# Patient Record
Sex: Male | Born: 1960
Health system: Southern US, Community
[De-identification: ages and names within clinical notes are randomized; demographics above are authoritative.]

## PROBLEM LIST (undated history)

## (undated) DIAGNOSIS — G4733 Obstructive sleep apnea (adult) (pediatric): Secondary | ICD-10-CM

## (undated) DIAGNOSIS — K648 Other hemorrhoids: Secondary | ICD-10-CM

## (undated) DIAGNOSIS — K219 Gastro-esophageal reflux disease without esophagitis: Secondary | ICD-10-CM

## (undated) DIAGNOSIS — I10 Essential (primary) hypertension: Secondary | ICD-10-CM

## (undated) DIAGNOSIS — E785 Hyperlipidemia, unspecified: Secondary | ICD-10-CM

## (undated) DIAGNOSIS — Z87442 Personal history of urinary calculi: Secondary | ICD-10-CM

## (undated) HISTORY — PX: PROSTATECTOMY: SHX69

## (undated) HISTORY — PX: CARDIOVASCULAR STRESS TEST: SHX262

## (undated) HISTORY — PX: TRANSTHORACIC ECHOCARDIOGRAM: SHX275

---

## 2005-03-01 ENCOUNTER — Inpatient Hospital Stay: Payer: Self-pay | Admitting: Internal Medicine

## 2005-03-01 ENCOUNTER — Other Ambulatory Visit: Payer: Self-pay

## 2005-08-10 ENCOUNTER — Emergency Department: Payer: Self-pay | Admitting: Emergency Medicine

## 2005-11-09 ENCOUNTER — Ambulatory Visit: Payer: Self-pay | Admitting: Gastroenterology

## 2007-04-27 ENCOUNTER — Emergency Department: Payer: Self-pay | Admitting: Emergency Medicine

## 2007-04-28 ENCOUNTER — Emergency Department: Payer: Self-pay | Admitting: Emergency Medicine

## 2007-05-01 ENCOUNTER — Ambulatory Visit: Payer: Self-pay | Admitting: Urology

## 2007-05-04 ENCOUNTER — Ambulatory Visit: Payer: Self-pay | Admitting: Urology

## 2007-05-10 ENCOUNTER — Ambulatory Visit: Payer: Self-pay | Admitting: Urology

## 2007-05-26 ENCOUNTER — Ambulatory Visit: Payer: Self-pay | Admitting: Urology

## 2007-05-29 ENCOUNTER — Ambulatory Visit: Payer: Self-pay | Admitting: Urology

## 2007-05-29 ENCOUNTER — Other Ambulatory Visit: Payer: Self-pay

## 2007-05-30 ENCOUNTER — Ambulatory Visit: Payer: Self-pay | Admitting: Urology

## 2007-06-13 ENCOUNTER — Emergency Department: Payer: Self-pay | Admitting: Emergency Medicine

## 2007-09-21 ENCOUNTER — Ambulatory Visit: Payer: Self-pay | Admitting: Urology

## 2007-09-25 ENCOUNTER — Ambulatory Visit: Payer: Self-pay | Admitting: Specialist

## 2007-10-05 IMAGING — CR DG ELBOW 2V*L*
1 series · 2 of 2 positions shown · non-contrast
Comparison: none

REASON FOR EXAM: pain in elbow  deformity  [HOSPITAL]
COMMENTS:

[Series 1: view not recorded · 0.17mm/px · 2 of 2 slices shown]
[im 1/2]
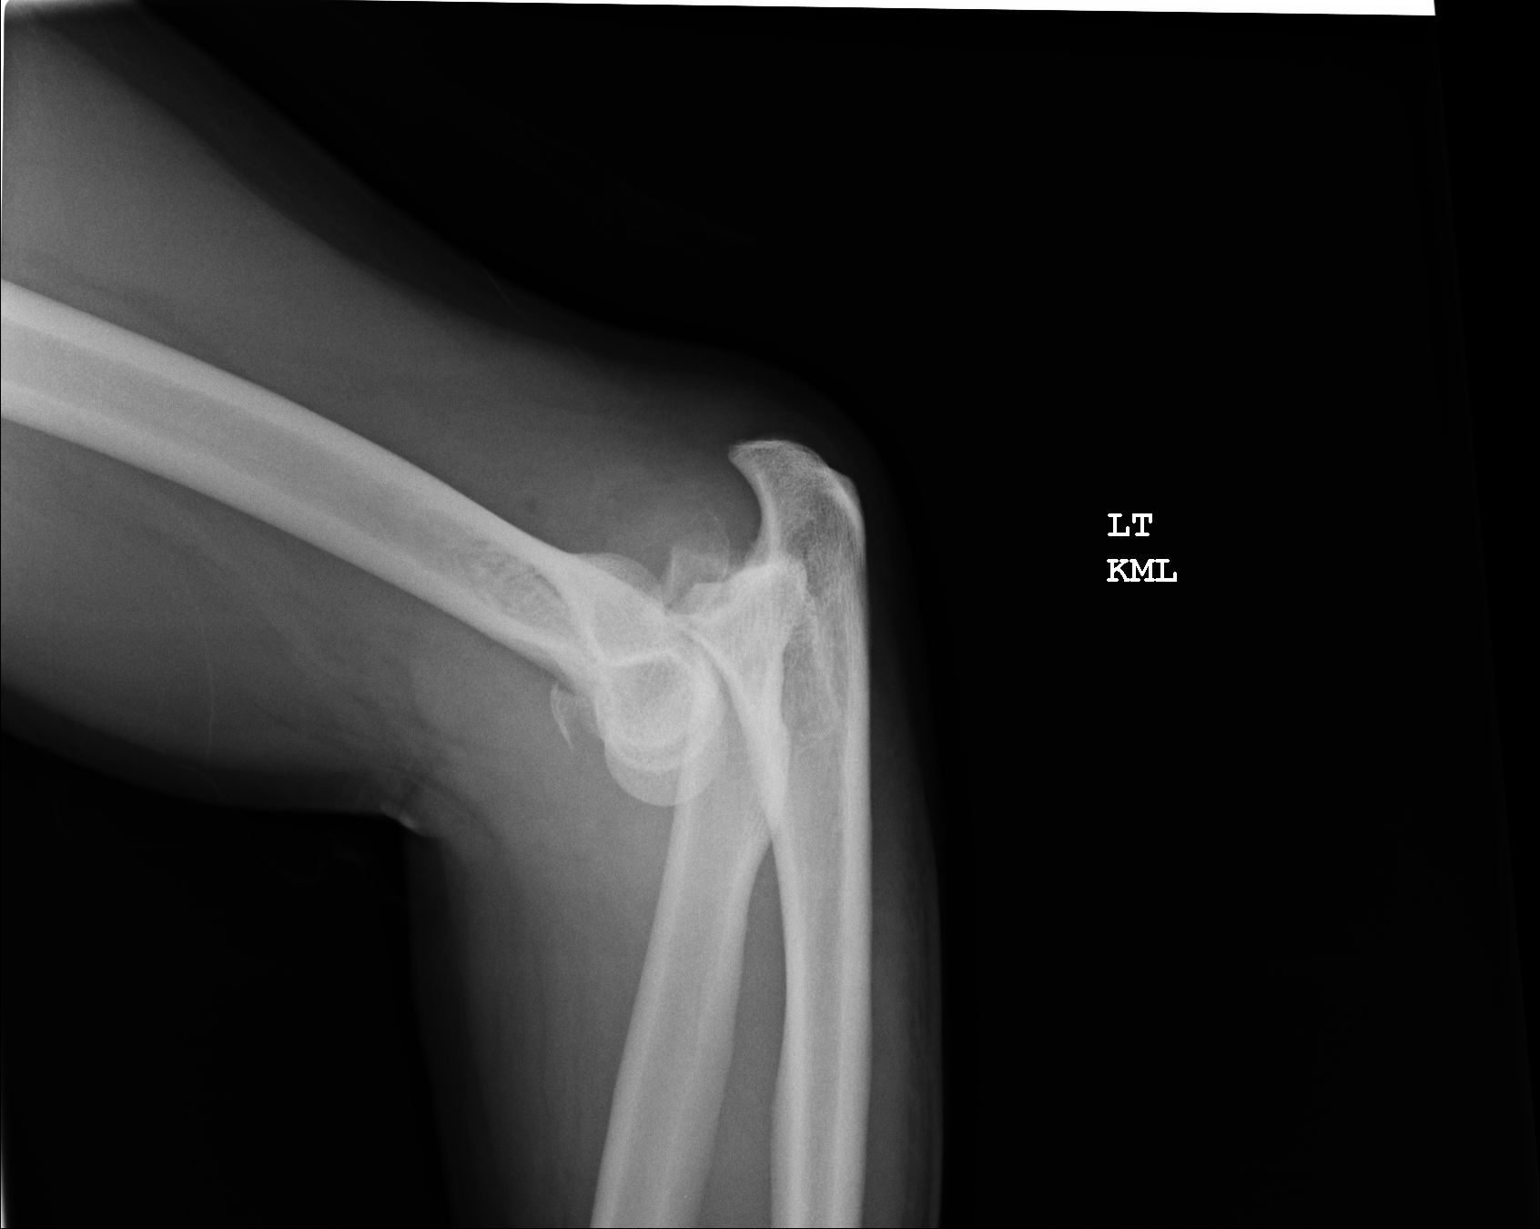
[im 2/2]
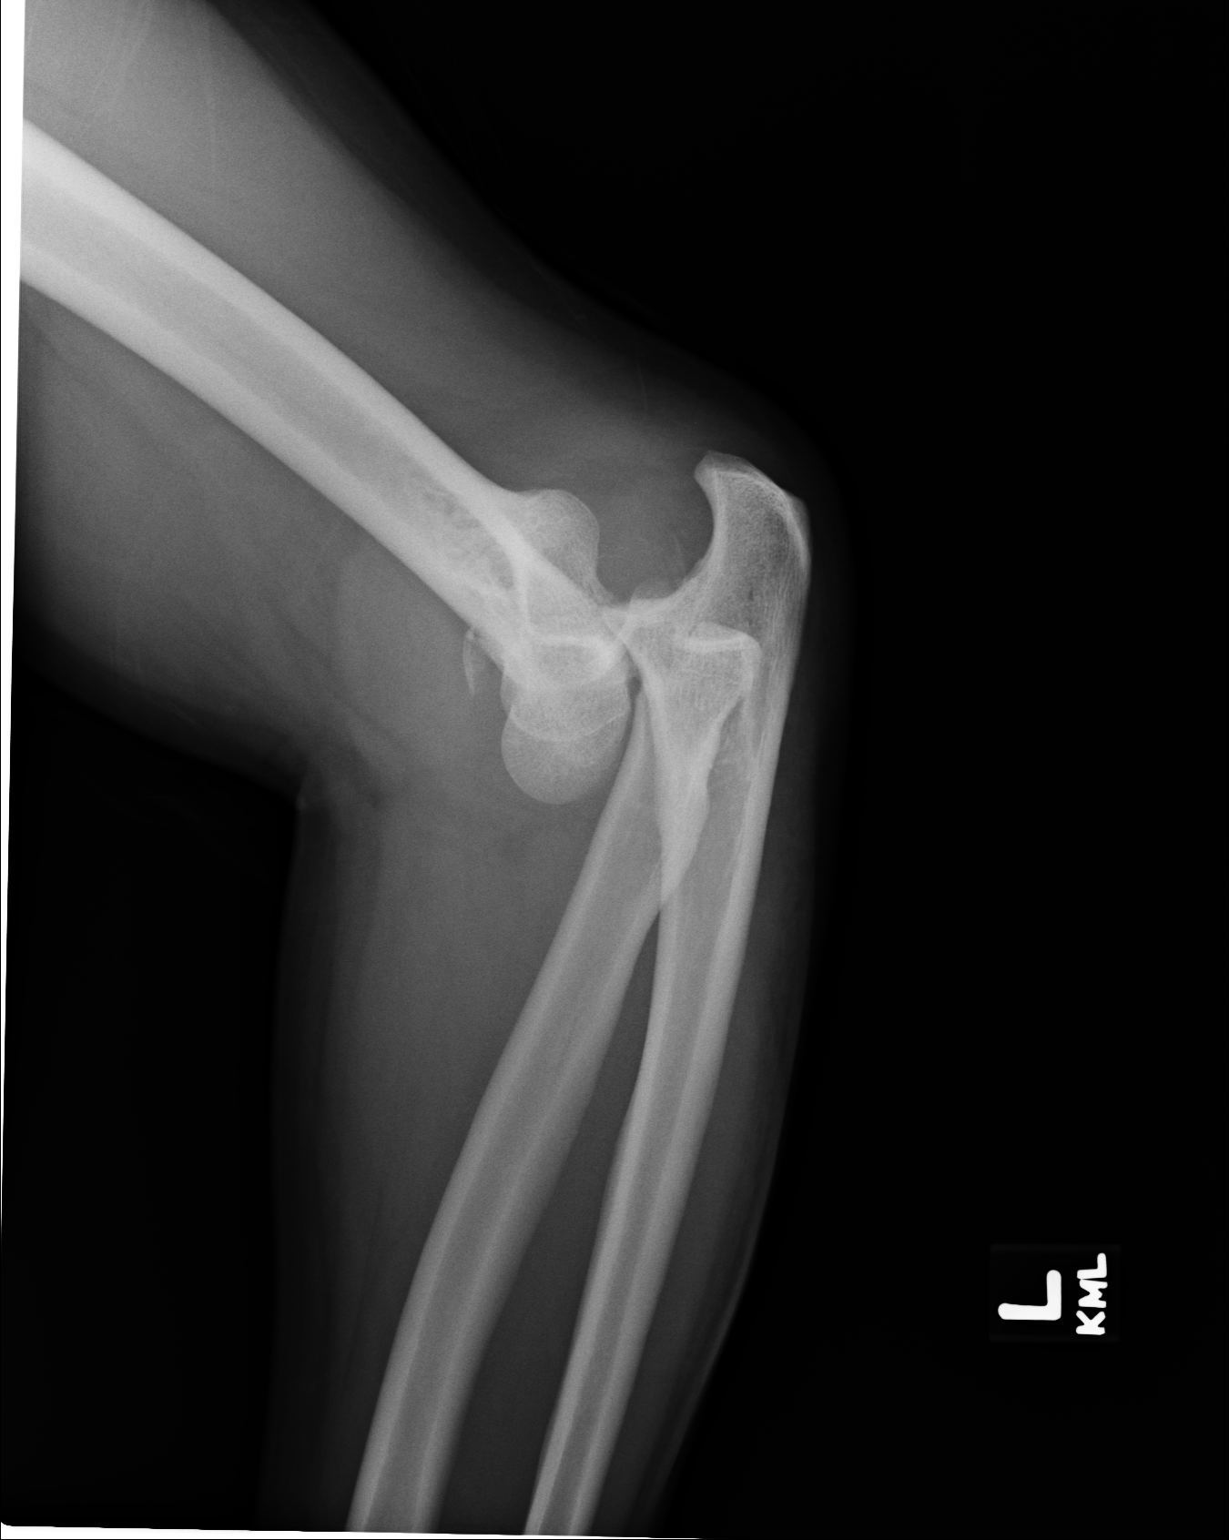

[2 of 2 positions shown; findings below may reference images not displayed]

PROCEDURE:     DXR - DXR ELBOW LEFT AP AND LATERAL  - June 13, 2007  [DATE]

RESULT:     Two lateral views of the elbow were obtained. There is anterior
dislocation of the humerus with respect to the olecranon fossa. There are
noted bony fracture fragments anterior and posterior to the distal humerus.
The origin of these fragments is unclear on the current view but at least
some of the fragments are likely from the radial head which appears deformed.
IMPRESSION: 1.     Fracture-dislocation at the LEFT elbow.

## 2007-10-05 IMAGING — CR DG ELBOW 2V*L*
1 series · 3 of 3 positions shown · non-contrast
Comparison: none

REASON FOR EXAM: post reduction elbow  [HOSPITAL]
COMMENTS:   Bedside (portable):Y

[Series 1: view not recorded · 0.17mm/px · 3 of 3 slices shown]
[im 1/3]
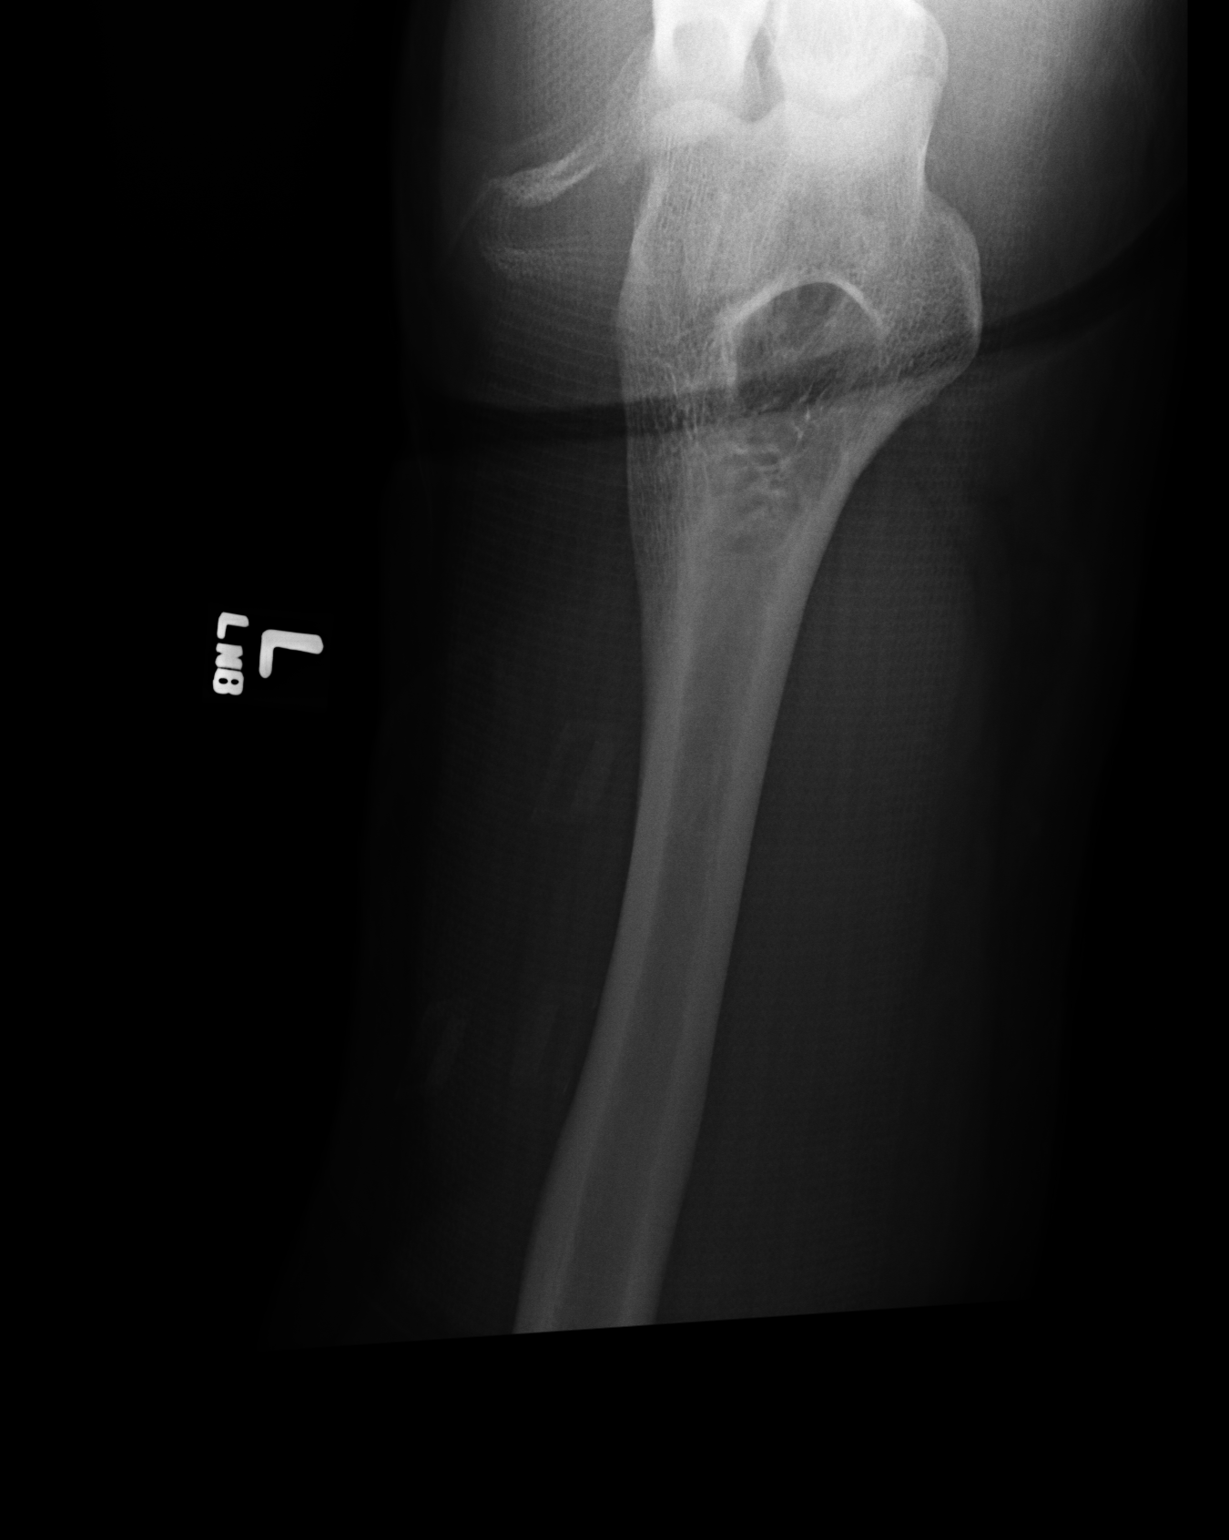
[im 2/3]
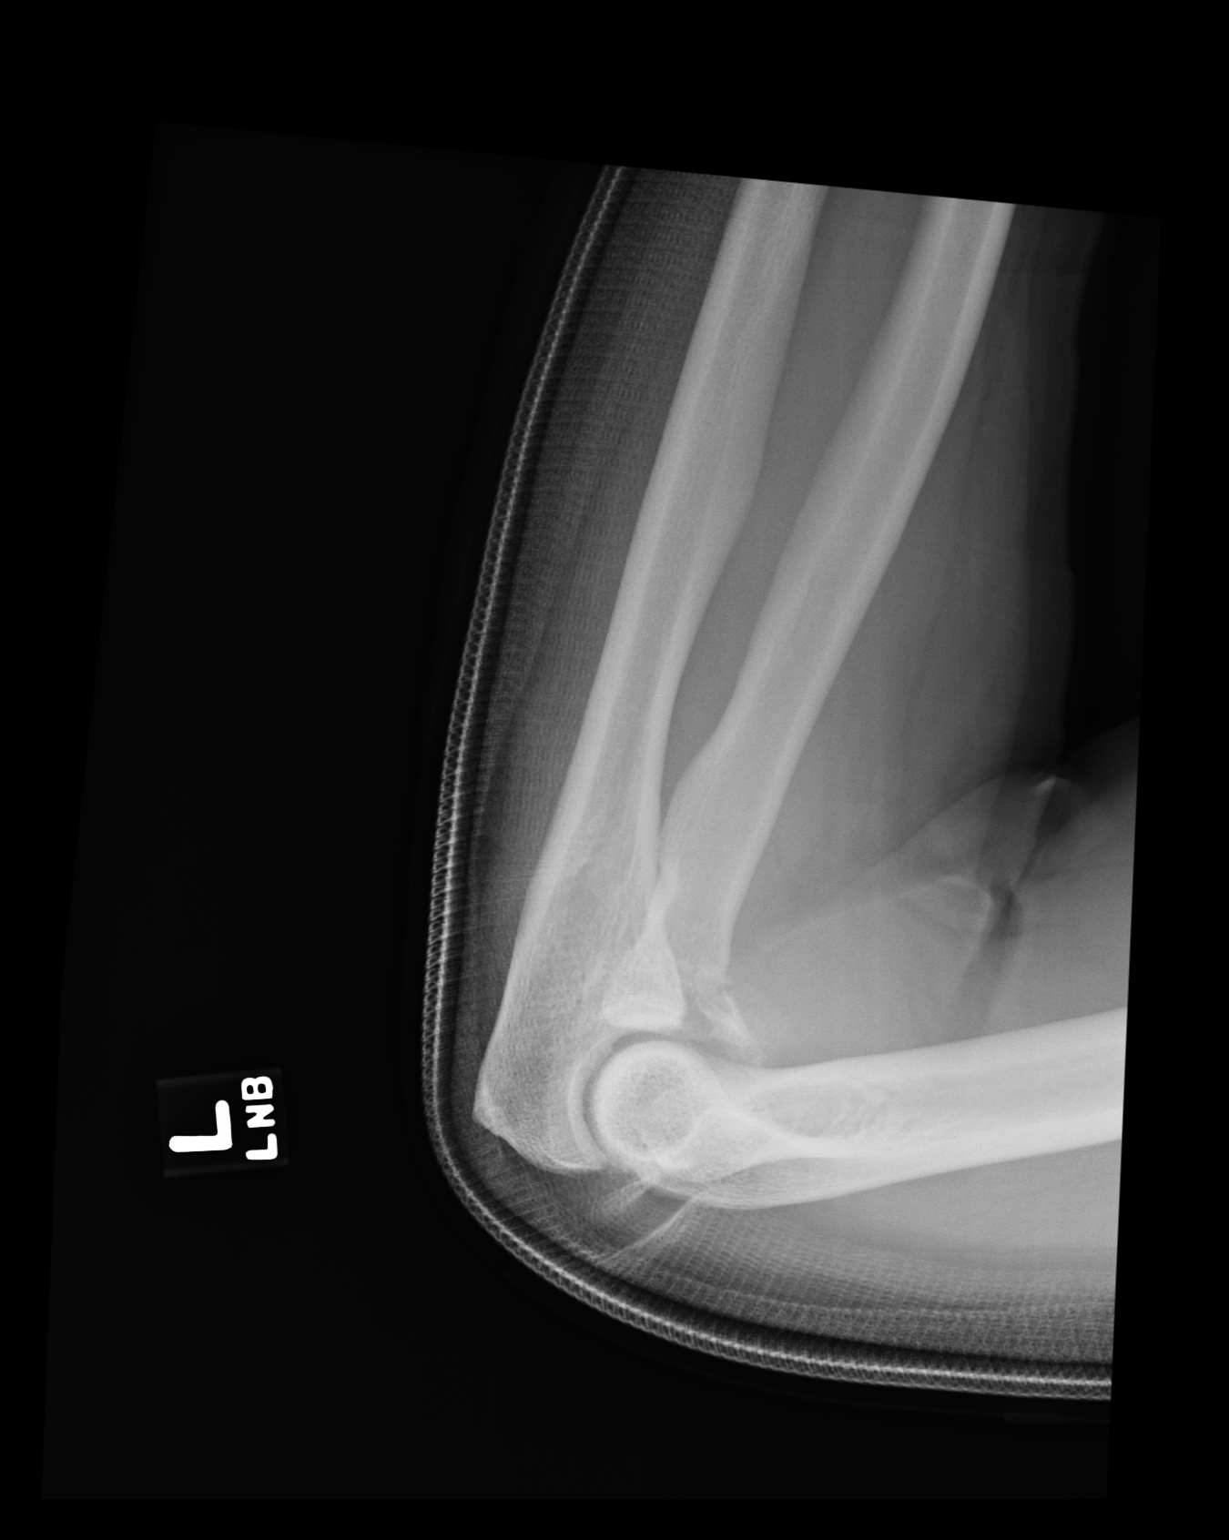
[im 3/3]
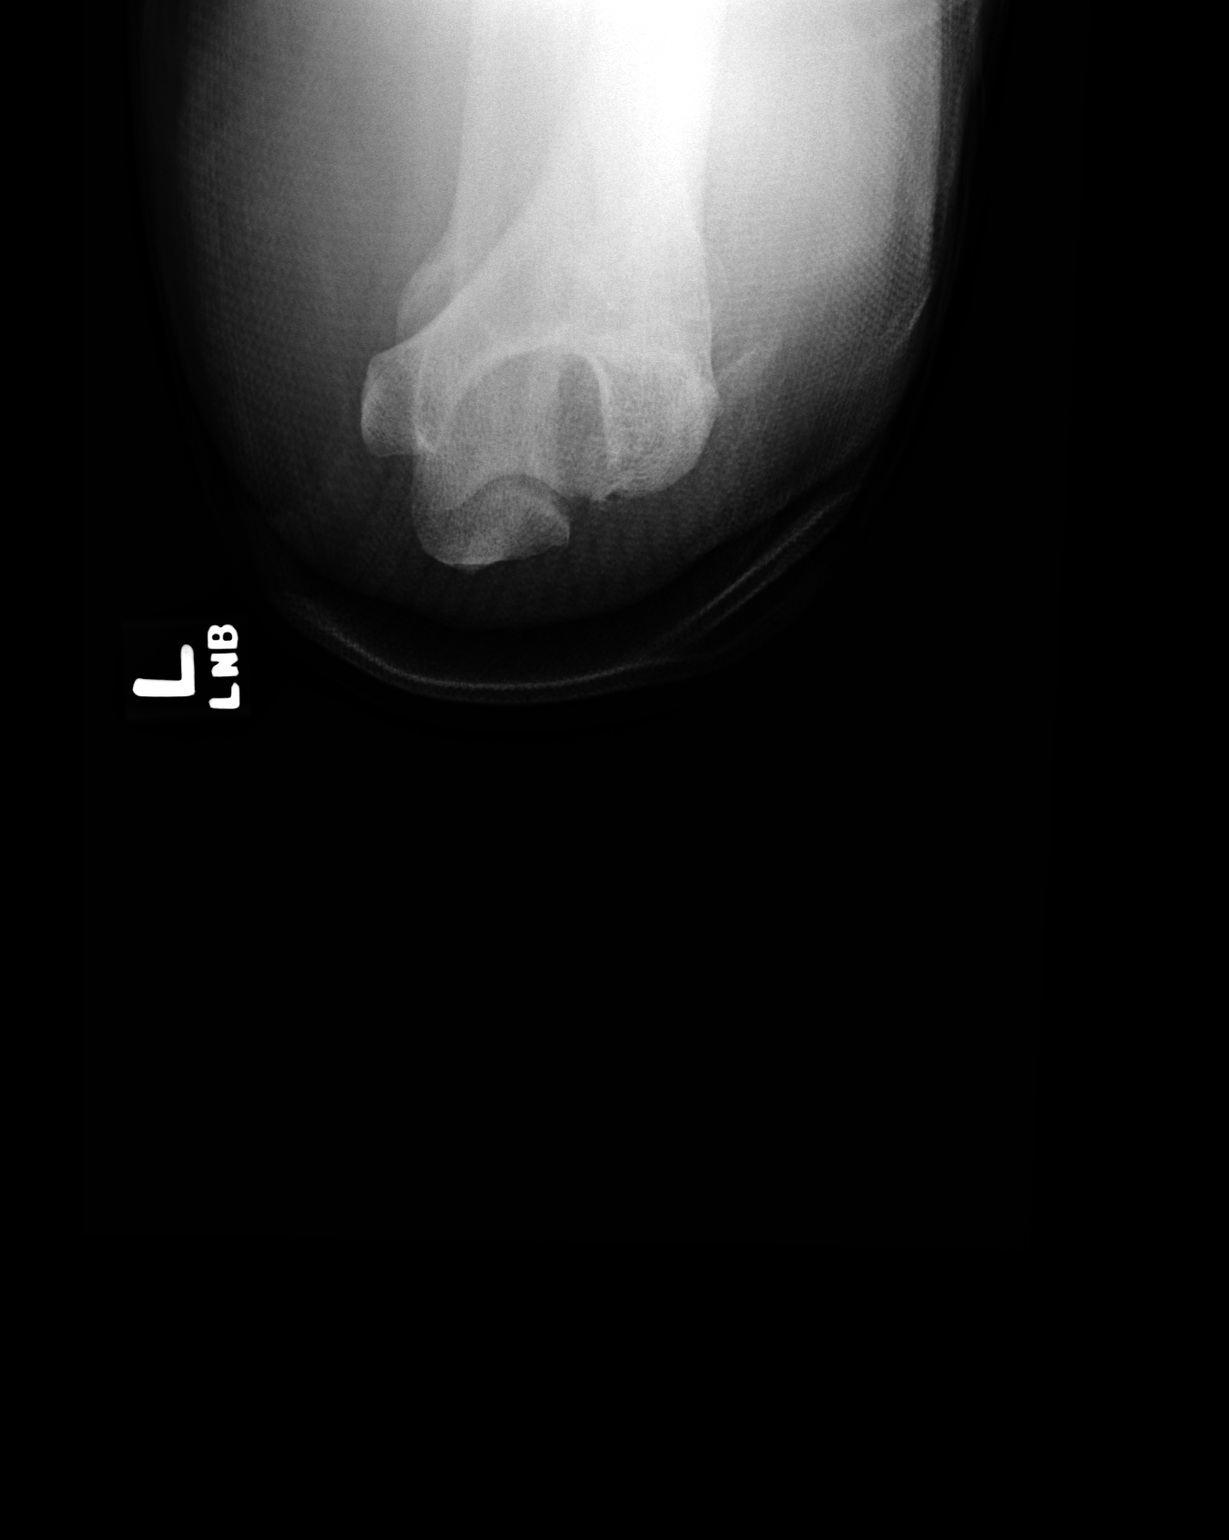

[3 of 3 positions shown; findings below may reference images not displayed]

PROCEDURE:     DXR - DXR ELBOW LEFT AP AND LATERAL  - June 13, 2007  [DATE]

RESULT:     Comparison is made to the prior exam of 06/13/2007. The previously
noted dislocation at the elbow has now been reduced. There is a comminuted
fracture of the radial head. Additionally, there may be a fracture of the
tip of the coronoid process. No fracture of the humerus is seen. A posterior
splint is now present.
IMPRESSION: 1.     Please see above.

## 2008-06-22 ENCOUNTER — Other Ambulatory Visit: Payer: Self-pay

## 2008-06-22 ENCOUNTER — Emergency Department: Payer: Self-pay | Admitting: Unknown Physician Specialty

## 2008-07-03 ENCOUNTER — Ambulatory Visit: Payer: Self-pay | Admitting: Urology

## 2008-09-25 ENCOUNTER — Ambulatory Visit: Payer: Self-pay | Admitting: Urology

## 2009-01-08 ENCOUNTER — Ambulatory Visit (HOSPITAL_COMMUNITY): Admission: RE | Admit: 2009-01-08 | Discharge: 2009-01-09 | Payer: Self-pay | Admitting: Orthopedic Surgery

## 2009-01-08 HISTORY — PX: ANTERIOR CERVICAL DISCECTOMY: SHX1160

## 2011-01-21 LAB — BASIC METABOLIC PANEL
BUN: 15 mg/dL (ref 6–23)
CO2: 28 mEq/L (ref 19–32)
Chloride: 107 mEq/L (ref 96–112)
Creatinine, Ser: 0.81 mg/dL (ref 0.4–1.5)
GFR calc Af Amer: >60 mL/min (ref >60)
Glucose, Bld: 75 mg/dL (ref 70–99)

## 2011-01-21 LAB — CBC
MCHC: 35.9 g/dL (ref 30.0–36.0)
MCV: 84.8 fL (ref 78.0–100.0)
RBC: 4.85 MIL/uL (ref 4.22–5.81)
RDW: 14 % (ref 11.5–15.5)

## 2011-01-21 LAB — DIFFERENTIAL
Basophils Relative: 1 % (ref 0–1)
Eosinophils Absolute: 0.1 10*3/uL (ref 0.0–0.7)
Monocytes Relative: 9 % (ref 3–12)
Neutrophils Relative %: 59 % (ref 43–77)

## 2011-01-21 LAB — GLUCOSE, CAPILLARY

## 2011-02-17 ENCOUNTER — Emergency Department: Payer: Self-pay | Admitting: Emergency Medicine

## 2011-02-23 NOTE — Op Note (Signed)
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   ADDITIONAL DIAGNOSIS:  Right arm radiculopathy C7 distribution.   OPERATIVE PROCEDURE:  Total disk replacement C6-7 and anterior cervical  diskectomy.   SURGEON:  Dahari D. Shon Baton, MD   INSTRUMENTATION USED:  Synthes ProDisc-C, it was a size 5-mm high extra-  deep prosthesis.   COMPLICATIONS:  None.   CONDITION:  Stable.   HISTORY:  Don Morgan is a very pleasant 50 year old gentleman who is having  significant neck and radicular arm pain.  Attempts at conservative  management have failed to alleviate his symptoms.  After discussing  treatment options with him, he elected to proceed with surgery.  All  appropriate risks, benefits, and alternatives were discussed with the  patient and consent was obtained.   OPERATIVE NOTE:  The patient was brought to the operating room, placed  supine on the operating table.  After successful induction of general  anesthesia and endotracheal intubation, TEDs and SCDs were applied.  The  arms were secured to the side.  Roller towels were placed between the  scapula and the shoulders were taped down.  The anterior cervical spine  was prepped and draped in a standard fashion.  A transverse incision was  made just below the level of the cricoid cartilage and sharp dissection  was carried out down to and through the platysma.  I then bluntly  dissected on the left-hand side performing a standard Smith-Robinson  approach to the anterior cervical  spine.   Once I was able to palpate the anterior cervical spine, I then placed a  Cloward retractor and I protected the trachea and esophagus medially,  with the retractor palpated and visualized the carotid sheath laterally  and protected it with my finger.  I then began dissecting the  prevertebral fascia off the anterior longitudinal ligament to expose the  6-7 disk space.  I placed a needle into the 6-7 disk space, took an  intraoperative x-ray and confirmed that I was at the appropriate level.  Once I confirmed I was at the C6-7 level, I began mobilizing the longus  colli muscles.  Using bipolar electrocautery, I obtained hemostasis, and  using an electrocautery I mobilized the longus colli muscles from the  body of C6 to the midbody of C7.  I then placed my deep Caspar  retractors underneath the longus colli muscles and then deflated the  endotracheal cuff.  I expanded the retractors to their final width and  reinflated the endotracheal cuff.  I then checked again with lateral  fluoroscopy that I was at the appropriate level.  Once I checked  again,  I then placed a distraction pin.  Using the AP and lateral fluoroscopy,  I identified the spinous process in the midline of the vertebral body  and placed the C7 40-mm distraction pin parallel to the C7 superior  endplate.  I placed the C6 spinous retractor again midline in the AP  plane and parallel to the C6 inferior endplate in the lateral plate.   At this point with the distraction and retraction in place, I then began  my diskectomy.   A 15-blade scalpel was used to incise the annulus.  Then using a  combination of pituitary rongeurs, curettes, and Kerrison rongeurs, I  resected all of the disk material.  I then removed the posterior  osteophytes with a 1-mm Kerrison and then developed a plane underneath  the posterior longitudinal ligament with a micro nerve hook.  Once I had  developed a plane, I then used my 1-mm Kerrison  rongeur to resect the  posterior longitudinal ligament.  On the right side, there was a  fragment of disk material that I was able to tease out with the nerve  hook.  Once I had completed the diskectomy and removed the ascending  osteophyte, I then measured the interbody space with the trial inserts.  I determined that the 5 large deep was the appropriate size.  I then  made sure it was at the appropriate depth and placed the milling guide  over it.  I then placed a temporary fixation pin into the body of C6 and  then used the mill to mill out the fin cut into C7.  I then placed the  blunt stabilizing pin and then removed the nail and then drilled the fin  cut into C6.  I then cleaned the fin cuts out and then checked again to  ensure that there was no fragments of bone pushed against the thecal  sac.  I then copiously irrigated with normal saline, obtained the  appropriate size disk replacement, and malleted it into the appropriate  depth.  Once it was flushed to the vertebral body under live  fluoroscopy, I then removed the distraction pins and the retractors and  then used bipolar electrocautery and bone wax to seal the bleeding.  The  fixation pins were sealed.  Those holes were sealed with bone wax, and  any other bleeders were coagulated and bone was sealed with bone wax.  I  then copiously irrigated again with normal saline and observed to ensure  that there was no bleeding.  Once I was comfortable with the fact there  was no significant bleeding, I then under live fluoroscopy took the neck  through a full range of motion from flexion to extension and lateral  bending.  I noticed that the prosthesis was working functionally very  fine both with direct visualization and with lateral live fluoroscopy.  I then took a final AP and lateral shot which demonstrated the device to  be properly seated in terms of depth and midline position in the AP.  I  then returned the trachea and  esophagus to midline, closed the platysma  with interrupted 2-0 Vicryl sutures, and the skin with 3-0 Monocryl.  Steri-Strips and dry dressing were applied as was a soft collar.  The  patient was then extubated, transferred to the PACU without incident.  At the end of the case, all needle and sponge counts were correct.       Alvy Beal, MD  Electronically Signed  Alvy Beal, MD  Electronically Signed    DDB/MEDQ  D:  01/08/2009  T:  01/09/2009  Job:  [id number]

## 2011-02-26 NOTE — Discharge Summary (Signed)
NAME:  Don Morgan, Don Morgan NO.:  [id number]   MEDICAL RECORD NO.:  [id number]          PATIENT TYPE:  OIB   LOCATION:  5013                         FACILITY:  [facility]   PHYSICIAN:  Alvy Beal, MD    DATE OF BIRTH:  [birth year]   DATE OF ADMISSION:  01/08/2009  DATE OF DISCHARGE:  01/09/2009                               DISCHARGE SUMMARY   CONSULTATIONS OBTAINED:  None.   ADMISSION DIAGNOSIS:  Cervical disk herniation and degenerative disk  disease at C6-7.   DISCHARGE DIAGNOSIS:  Cervical disk herniation and degenerative disk  disease at C6-7.   BRIEF HISTORY:  [name] is a very pleasant 50 year old gentleman who  was having a significant neck and radicular arm pain.  All attempts at  conservative management consisting of physical therapy, injection  therapy, and pain management had failed to alleviate his symptoms.  After discussing treatment options with him, he elected to proceed with  surgery.  All appropriate risks, benefits, and alternatives were  discussed with the patient and proper consent was obtained.  The  patient's hospital course was approximately 24 hours in length.  The  patient tolerated the procedure very well and was transferred from the  PACU to the orthopedic floor without incident.   Postoperatively, on day 1, the patient was having no complaints of  significant radicular arm pain.  He worked very well with physical  therapy.  He was tolerating a regular diet, had a regular bowel  movements.  Therefore physical therapy worked with him and cleared him  for discharge to home, because he was deemed stable after physical  therapy and occupational therapy had seen him.  He was discharged to  home in stable condition.   DISCHARGE MEDICATIONS:  1. Omeprazole 20 mg.  2. Hydrochlorothiazide 25 mg.  3. Amlodipine 5 mg.  4. Sertraline 100 mg.  5. Percocet 10/325 one tablet p.o. q.6 h. p.r.n. pain.  6. Indomethacin 1 tablet p.o. t.i.d. for 2  weeks, the strength is 800      mg.  7. Robaxin 500 mg 1 tablet p.o. q.8 h. p.r.n. pain.   The patient was instructed to discontinue the use of his  hydrocodone/acetaminophen 5/325.   DISCHARGE INSTRUCTIONS:  The patient was given a preprinted discharge  instruction sheet that went over his activity level such that the  patient is not allowed to lift anything heavier than 6 pounds.  He is to  remain in his hard Aspen cervical collar at all times.  He does not need  to wear his hard cervical collar while in bed.  He is to avoid smoking  or exposure to a cigarette smoke.  He is to walk as much as possible.  The patient is instructed also to call  the office for increased fevers, chills, temperature, or increased pain.  The patient is instructed to call our office at [phone] to schedule his  follow up appointment, which to be 2 weeks from the date of his surgery  for suture removal.  At that time, we will do a wound check and  remove  his sutures.      Crissie Reese, PA      Alvy Beal, MD  Electronically Signed    AC/MEDQ  D:  01/28/2009  T:  01/29/2009  Job:  [id number]   cc:   Alvy Beal, MD  [facility]

## 2012-10-11 HISTORY — PX: CYSTOSCOPY/RETROGRADE/URETEROSCOPY/STONE EXTRACTION WITH BASKET: SHX5317

## 2012-10-11 HISTORY — PX: EXTRACORPOREAL SHOCK WAVE LITHOTRIPSY: SHX1557

## 2014-08-08 ENCOUNTER — Ambulatory Visit: Payer: Self-pay | Admitting: Family Medicine

## 2014-09-20 ENCOUNTER — Emergency Department: Payer: Self-pay | Admitting: Student

## 2016-01-11 ENCOUNTER — Emergency Department: Payer: Medicare Other

## 2016-01-11 ENCOUNTER — Encounter: Payer: Self-pay | Admitting: Emergency Medicine

## 2016-01-11 ENCOUNTER — Emergency Department
Admission: EM | Admit: 2016-01-11 | Discharge: 2016-01-11 | Disposition: A | Discharge status: Home / Self Care | Payer: Medicare Other | Attending: Emergency Medicine | Admitting: Emergency Medicine

## 2016-01-11 DIAGNOSIS — Z885 Allergy status to narcotic agent status: Secondary | ICD-10-CM | POA: Insufficient documentation

## 2016-01-11 DIAGNOSIS — R1030 Lower abdominal pain, unspecified: Secondary | ICD-10-CM | POA: Insufficient documentation

## 2016-01-11 DIAGNOSIS — R109 Unspecified abdominal pain: Secondary | ICD-10-CM

## 2016-01-11 DIAGNOSIS — K5900 Constipation, unspecified: Secondary | ICD-10-CM

## 2016-01-11 DIAGNOSIS — I10 Essential (primary) hypertension: Secondary | ICD-10-CM | POA: Insufficient documentation

## 2016-01-11 HISTORY — DX: Essential (primary) hypertension: I10

## 2016-01-11 LAB — CBC WITH DIFFERENTIAL/PLATELET
BASOS ABS: 0 10*3/uL (ref 0–0.1)
Basophils Relative: 1 %
Eosinophils Absolute: 0 10*3/uL (ref 0–0.7)
Eosinophils Relative: 0 %
HEMATOCRIT: 38.3 % — AB (ref 40.0–52.0)
Hemoglobin: 13 g/dL (ref 13.0–18.0)
LYMPHS ABS: 0.9 10*3/uL — AB (ref 1.0–3.6)
LYMPHS PCT: 19 %
MCH: 26.9 pg (ref 26.0–34.0)
MCHC: 34 g/dL (ref 32.0–36.0)
MCV: 79.1 fL — ABNORMAL LOW (ref 80.0–100.0)
MONO ABS: 0.8 10*3/uL (ref 0.2–1.0)
MONOS PCT: 16 %
NEUTROS ABS: 2.9 10*3/uL (ref 1.4–6.5)
Neutrophils Relative %: 64 %
PLATELETS: 298 10*3/uL (ref 150–440)
RBC: 4.84 MIL/uL (ref 4.40–5.90)
RDW: 15.5 % — AB (ref 11.5–14.5)
WBC: 4.6 10*3/uL (ref 3.8–10.6)

## 2016-01-11 LAB — LIPASE, BLOOD: Lipase: 19 U/L (ref 11–51)

## 2016-01-11 LAB — URINALYSIS COMPLETE WITH MICROSCOPIC (ARMC ONLY)
BACTERIA UA: NONE SEEN
Bilirubin Urine: NEGATIVE
Glucose, UA: NEGATIVE mg/dL
Leukocytes, UA: NEGATIVE
NITRITE: NEGATIVE
PH: 5 (ref 5.0–8.0)
PROTEIN: NEGATIVE mg/dL
SPECIFIC GRAVITY, URINE: 1.017 (ref 1.005–1.030)
Squamous Epithelial / LPF: NONE SEEN

## 2016-01-11 LAB — TROPONIN I

## 2016-01-11 LAB — COMPREHENSIVE METABOLIC PANEL
ALBUMIN: 3.9 g/dL (ref 3.5–5.0)
ALK PHOS: 69 U/L (ref 38–126)
ALT: 23 U/L (ref 17–63)
ANION GAP: 3 — AB (ref 5–15)
AST: 26 U/L (ref 15–41)
BUN: 10 mg/dL (ref 6–20)
CALCIUM: 8.4 mg/dL — AB (ref 8.9–10.3)
CHLORIDE: 105 mmol/L (ref 101–111)
CO2: 25 mmol/L (ref 22–32)
Creatinine, Ser: 0.92 mg/dL (ref 0.61–1.24)
GFR calc Af Amer: >60 mL/min (ref >60)
GFR calc non Af Amer: >60 mL/min (ref >60)
GLUCOSE: 89 mg/dL (ref 65–99)
Potassium: 3.1 mmol/L — ABNORMAL LOW (ref 3.5–5.1)
SODIUM: 133 mmol/L — AB (ref 135–145)
Total Bilirubin: 0.7 mg/dL (ref 0.3–1.2)
Total Protein: 7.6 g/dL (ref 6.5–8.1)

## 2016-01-11 MED ORDER — LACTULOSE 20 G PO PACK: 20.0000 g | PACK | Freq: Three times a day (TID) | ORAL | Status: DC

## 2016-01-11 MED ORDER — DIATRIZOATE MEGLUMINE & SODIUM 66-10 % PO SOLN
15.0000 mL | Freq: Once | ORAL | Status: AC
  Administered 2016-01-11: 15 mL via ORAL

## 2016-01-11 MED ORDER — IOPAMIDOL (ISOVUE-300) INJECTION 61%
100.0000 mL | Freq: Once | INTRAVENOUS | Status: AC | PRN
  Administered 2016-01-11: 100 mL via INTRAVENOUS

## 2016-01-11 MED ORDER — ONDANSETRON HCL 4 MG PO TABS: 4.0000 mg | ORAL_TABLET | Freq: Every day | ORAL | Status: DC | PRN

## 2016-01-11 MED ORDER — FENTANYL CITRATE (PF) 100 MCG/2ML IJ SOLN
50.0000 ug | Freq: Once | INTRAMUSCULAR | Status: AC
  Administered 2016-01-11: 50 ug via INTRAVENOUS
  Filled 2016-01-11: qty 2

## 2016-01-11 MED ORDER — SODIUM CHLORIDE 0.9 % IV BOLUS (SEPSIS)
500.0000 mL | Freq: Once | INTRAVENOUS | Status: AC
  Administered 2016-01-11: 500 mL via INTRAVENOUS

## 2016-01-11 MED ORDER — DOCUSATE SODIUM 100 MG PO CAPS: 100.0000 mg | ORAL_CAPSULE | Freq: Every day | ORAL | Status: AC | PRN

## 2016-01-11 NOTE — Discharge Instructions (Signed)

## 2016-01-11 NOTE — ED Notes (Signed)
Pt at CT

## 2016-01-11 NOTE — ED Notes (Signed)
C/o lower abdominal pain for 3-4 weeks.  Reports not able to have decent bowel movement.  Has been nauseated but no vomiting.  Has had trouble urinating.  Also reports some mild chest discomfort that started today.  Has had cough.  Chest pain has been constant though.

## 2016-01-11 NOTE — ED Notes (Signed)
MD McShane at bedside  

## 2016-01-11 NOTE — ED Notes (Signed)
Pt voided - residual urine 165 mL. Pt voided a second time - residual urine 18 mL.

## 2016-01-11 NOTE — ED Provider Notes (Addendum)
[facility] Emergency Department Provider Note  ____________________________________________   I have reviewed the triage vital signs and the nursing notes.   HISTORY  Chief Complaint Abdominal Pain and Chest Pain    HPI Don Morgan is a 55 y.o. male  with no history of surgery in the past, presents today complaining of abdominal bloating for the last 4 weeks. He states that he feels distended. He did have a negative colonoscopy 2 years ago. Patient has not had any fevers or chills. He has not had any vomiting. He states that his lower abdominal discomfort has been waxing and waning for the last month. He chooses to his inability to have a "satisfying" bowel movement. He states he hasn't had a normal bowel movement and the month. He denies any other obstructive symptoms. He feels "gassy". He also states that sometimes he has hesitation when he urinates but no dysuria or urinary frequency. Denies any fever. He denies any chest pain, he states he did have some upper abdominal pain as well but that is gone. He specifically states at no time did he have any chest pain. Patient has had recent cardiac workup which was negative. He has had no shortness of breath. He did develop a cough recently. The cough and URI symptoms seem to be improving however. They came after and seemed to be going away before the lower abdominal discomfort. His been eating and drinking well, he has not had any significant weight loss. No melena or bright red blood per rectum. Does have a history of hemorrhoids. States he has not had any food related abdominal pain. Despite symptoms for a month has not talked to his physician about this yet. The patient states that it's a cramping discomfort in the lower abdomen which is the primary problem. No history of urinary retention that he knows of or prostate issues. The gassy pain in his stomach is lower, constant, nonradiating except for occasionally to the upper  abdomen as discussed above, nothing makes it better except for occasionally passing gas nothing makes it worse. No other associated symptoms. The patient does state that he is aware of a family history of prostate issues but no family history of Crohn's disease or ulcerative colitis.   Past Medical History  Diagnosis Date  . Hypertension   . Kidney stone     There are no active problems to display for this patient.   Past Surgical History  Procedure Laterality Date  . Neck surgery    . Elbow surgery    . Lithotripsy      No current outpatient prescriptions on file.  Allergies Morphine and related  History reviewed. No pertinent family history.  Social History Social History  Substance Use Topics  . Smoking status: Never Smoker   . Smokeless tobacco: None  . Alcohol Use: No    Review of Systems Constitutional: No fever/chills Eyes: No visual changes. ENT: No sore throat. No stiff neck no neck pain Cardiovascular: Denies chest pain. Respiratory: Denies shortness of breath. Gastrointestinal:   no vomiting.  No diarrhea.  Positive constipation. Genitourinary: Negative for dysuria. Musculoskeletal: Negative lower extremity swelling Skin: Negative for rash. Neurological: Negative for headaches, focal weakness or numbness. 10-point ROS otherwise negative.  ____________________________________________   PHYSICAL EXAM:  VITAL SIGNS: ED Triage Vitals  Enc Vitals Group     BP 01/11/16 1503 142/92 mmHg     Pulse Rate 01/11/16 1503 109     Resp 01/11/16 1503 16  Temp 01/11/16 1503 98.9 F (37.2 C)     Temp Source 01/11/16 1503 Oral     SpO2 01/11/16 1503 96 %     Weight 01/11/16 1503 235 lb (106.595 kg)     Height 01/11/16 1503 5\' 11"  (1.803 m)     Head Cir --      Peak Flow --      Pain Score 01/11/16 1511 7     Pain Loc --      Pain Edu? --      Excl. in GC? --     Constitutional: Alert and oriented. Well appearing and in no acute distressSomewhat  anxious Eyes: Conjunctivae are normal. PERRL. EOMI. Head: Atraumatic. Nose: No congestion/rhinnorhea. Mouth/Throat: Mucous membranes are moist.  Oropharynx non-erythematous. Neck: No stridor.   Nontender with no meningismus Cardiovascular: Normal rate, regular rhythm. Grossly normal heart sounds.  Good peripheral circulation. Respiratory: Normal respiratory effort.  No retractions. Lungs CTAB. Abdominal: SoftMildly obese, minimal discomfort to the lower abdomen no guarding no rebound on surgical abdomen. Positive bowel sounds.. Back:  There is no focal tenderness or step off there is no midline tenderness there are no lesions noted. there is no CVA tenderness GU: Normal external male genitalia no masses no lesions Musculoskeletal: No lower extremity tenderness. No joint effusions, no DVT signs strong distal pulses no edema Neurologic:  Normal speech and language. No gross focal neurologic deficits are appreciated.  Skin:  Skin is warm, dry and intact. No rash noted. Psychiatric: Mood and affect are somewhat anxious. Speech and behavior are normal.  ____________________________________________   LABS (all labs ordered are listed, but only abnormal results are displayed)  Labs Reviewed  COMPREHENSIVE METABOLIC PANEL - Abnormal; Notable for the following:    Sodium 133 (*)    Potassium 3.1 (*)    Calcium 8.4 (*)    Anion gap 3 (*)    All other components within normal limits  URINALYSIS COMPLETEWITH MICROSCOPIC ([facility] ONLY) - Abnormal; Notable for the following:    Color, Urine YELLOW (*)    APPearance CLEAR (*)    Ketones, ur TRACE (*)    Hgb urine dipstick 1+ (*)    All other components within normal limits  CBC WITH DIFFERENTIAL/PLATELET - Abnormal; Notable for the following:    HCT 38.3 (*)    MCV 79.1 (*)    RDW 15.5 (*)    Lymphs Abs 0.9 (*)    All other components within normal limits  LIPASE, BLOOD  TROPONIN I   ____________________________________________  EKG  I  personally interpreted any EKGs ordered by me or triage Normal sinus rhythm at 97 bpm no acute ST elevation or acute ST depression normal axis unremarkable EKG no change from prior ____________________________________________  RADIOLOGY  I reviewed any imaging ordered by me or triage that were performed during my shift and, if possible, patient and/or family made aware of any abnormal findings. ____________________________________________   PROCEDURES  Procedure(s) performed: None  Critical Care performed: None  ____________________________________________   INITIAL IMPRESSION / ASSESSMENT AND PLAN / ED COURSE  Pertinent labs & imaging results that were available during my care of the patient were reviewed by me and considered in my medical decision making (see chart for details).  Patient with now 1 month of bloating feeling in his lower abdomen with although he is having bowel movements that are not sufficient to make him feel that he is having satisfying bowel movements. He denies any fever or chills. He has  a nonsurgical abdomen. He is anxious about this issue. Reassuringly his abdomen is nonsurgical. He does not likely have a surgical process is been going on for one month. Certainly no indication clinically that he has obstruction. Nonetheless I did order a CT scan, CT scan is normal, white count is normal, he is not anemic, platelet count is normal, urine is normal, his electrolytes are reassuring set was slight hypokalemia. We will replete that. There is no evidence of pancreatitis is no evidence of gallbladder disease, there is no evidence of obstruction there is no evidence of diverticulitis there is no evidence of volvulus is no evidence of obstipation, at this time, likely we will be able to treat the patient with aggressive bowel prep at home. I am getting a postvoid residual volume to see if this is a retained urinary issue although there is no evidence of urinary tract  infection, there is no evidence of bladder or renal pathology on CT. Patient is not actually having any chest pain, he did have a diffuse abdominal discomfort as described above. Nonetheless we did do an EKG which is normal and we also sent a cardiac enzyme which is negative. I do not think serial cardiac enzymes are indicated in this patient given the chronicity of his symptoms and the clear reproducibility of his abdominal discomfort. Patient has seen a GI doctor, I think that we'll be the next step for him of his postvoid residual volume is reassuring. Extensive return precautions and follow-up given and understood. We'll give aggressive anticonstipation treatment at home. I will also do a rectal exam to make sure there is no evidence of impaction. CT however does not demonstrate anything of that variety nor does it demonstrate any degree of significant constipation. ____________________________________________ ----------------------------------------- 7:44 PM on 01/11/2016 -----------------------------------------   Patient states he is feeling somewhat better at this time, he has a benign abdomen at this time. Rectal exam is normal with evidence of old skin tag hemorrhoids but no evidence of active bleeding guaiac-negative with no mass or prostate mass noted on limited exam. Patient states that he just feels a "churning" sensation  now and again. I do not think this represents ischemic gut. Patient certainly could've developed food allergies or could be many other causes for this but his workup is benign as we can make it here and we will discharge him home with close outpatient follow-up with GI. He did note also that his postvoid residual volume is 18 MLS.   FINAL CLINICAL IMPRESSION(S) / ED DIAGNOSES  Final diagnoses:  Abdominal pain      This chart was dictated using voice recognition software.  Despite best efforts to proofread,  errors can occur which can change meaning.     Jeanmarie Plant, MD 01/11/16 1610  Jeanmarie Plant, MD 01/11/16 1945  Jeanmarie Plant, MD 01/11/16 [phone]

## 2016-11-30 NOTE — Progress Notes (Signed)
 Established Patient Visit   Chief Complaint: Chief Complaint  Patient presents with  . Follow-up    1 year    Date of Service: 11/29/2016 Date of Birth: 1961/04/21 PCP: NONE  History of Present Illness: Mr. Matusek is a 56 y.o.male patient who has a history of disability secondary to work injury to his back and neck.  Patient states of doing reasonably well still a blood pressure medication with borderline control.  Patient feels okay has been on total disability since 2012 because of back injury at work he has had disc replacement surgery done at [facility].  Patient has had multiple restrictions in his activity and is unable to work his previous job patient had initial procedure on his neck in 2010 in the past she has had noninvasive cardiac evaluation with a negative.  Patient was seen in the past for occasional he has had colonoscopies in the past now here for routine follow-up.  Past Medical and Surgical History  Past Medical History Past Medical History:  Diagnosis Date  . GERD (gastroesophageal reflux disease)   . Hyperlipidemia, unspecified   . Hypertension     Past Surgical History He has no past surgical history on file.   Medications and Allergies  Current Medications  Current Outpatient Prescriptions  Medication Sig Dispense Refill  . aspirin 81 MG EC tablet Take 81 mg by mouth once daily.    SABRA atorvastatin (LIPITOR) 20 MG tablet Take 20 mg by mouth once daily.    . nitroGLYcerin (NITROSTAT) 0.4 MG SL tablet Place 0.4 mg under the tongue every 5 (five) minutes as needed for Chest pain. May take up to 3 doses.    SABRA lisinopril-hydrochlorothiazide (PRINZIDE,ZESTORETIC) 20-12.5 mg tablet Take 2 tablets by mouth once daily. 60 tablet 11   No current facility-administered medications for this visit.     Allergies: Morphine (bulk)  Social and Family History  Social History  reports that he has never smoked. He has never used smokeless tobacco.  Family History History  reviewed. No pertinent family history.  Review of Systems   Review of Systems: The patient denies chest pain, shortness of breath, orthopnea, paroxysmal nocturnal dyspnea, pedal edema, palpitations, heart racing, presyncope, syncope. Review of 12 Systems is negative except as described above.  Physical Examination   Vitals:BP (!) 142/96   Pulse 71   Ht 180.3 cm (5' 11)   Wt (!) 103.9 kg (229 lb)   SpO2 99%   BMI 31.94 kg/m  Ht:180.3 cm (5' 11) Wt:(!) 103.9 kg (229 lb) ADJ:Anib surface area is 2.28 meters squared. Body mass index is 31.94 kg/m.  HEENT: Pupils equally reactive to light and accomodation  Neck: Supple without thyromegaly, carotid pulses 2+ Lungs: clear to auscultation bilaterally; no wheezes, rales, rhonchi Heart: Regular rate and rhythm.  No gallops, murmurs or rub Abdomen: soft nontender, nondistended, with normal bowel sounds Extremities: no cyanosis, clubbing, or edema Peripheral Pulses: 2+ in all extremities, 2+ femoral pulses bilaterally  Assessment   56 y.o. male with  1. Angina at rest (CMS-HCC)   2. SOB (shortness of breath)   3. Abnormal EKG   4. Mild obesity, unspecified   5. Other hyperlipidemia   6. Gastroesophageal reflux disease without esophagitis   7. OSA (obstructive sleep apnea)   8. Hypertension, essential        Plan  1 hypertension borderline control recommend increasing lisinopril to 40/25 mg HCTZ 2 obesity recommend weight loss exercise portion control 3 hyperlipidemia continue Lipitor therapy  for lipid management 4 obstructive sleep apnea recommend sleep study CPAP weight loss 5 GERD recommend omeprazole or Zantac as necessary 6 abnormal EKG by history continue conservative medical 7 have the patient follow-up in 1 year      Return in about 1 year (around 11/29/2017).  DWAYNE D CALLWOOD, MD

## 2017-01-11 ENCOUNTER — Other Ambulatory Visit: Payer: Self-pay | Admitting: General Surgery

## 2017-01-11 NOTE — H&P (Signed)
History of Present Illness Romie Levee MD; 01/03/2017 4:56 PM) The patient is a 56 year old male who presents with a complaint of Rectal bleeding. 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 Bosie Clos. He underwent a colonoscopy in February 2015 which showed no abnormalities except for internal hemorrhoids which were medium in size. Externally anal condyloma was noted. Patient states that he developed an episode of severe rectal bleeding that lasted approximately 3 weeks. He called Dr. Marge Duncans office and was prescribed a hemorrhoid cream. He states that he did use some of the cream but his bleeding worsened and he stopped. I saw him in the office in 2016 and recommended hemorrhoid suppositories. He did not get this filled due to cost. Since then he continues to have rectal bleeding with bowel movements. It is becoming rather severe. He has stopped his aspirin therapy.   Problem List/Past Medical Romie Levee, MD; 01/03/2017 4:58 PM) BLEEDING INTERNAL HEMORRHOIDS (K64.8)   Past Surgical History Romie Levee, MD; 01/03/2017 4:58 PM) Spinal Surgery - Neck  Vasectomy   Diagnostic Studies History Romie Levee, MD; 01/03/2017 4:58 PM) Colonoscopy  within last year  Allergies Michel Bickers, LPN; 6/96/2952 8:41 PM) Morphine Derivatives  Hives.  Medication History Romie Levee, MD; 01/03/2017 4:58 PM) Lisinopril-Hydrochlorothiazide (20-12.5MG  Tablet, Oral) Active.  Social History Romie Levee, MD; 01/03/2017 4:58 PM) Alcohol use  Occasional alcohol use. Caffeine use  Coffee, Tea. No drug use  Tobacco use  Never smoker.  Family History Romie Levee, MD; 01/03/2017 4:58 PM) Arthritis  Family Members In General. Cancer  Father. Hypertension  Mother. Prostate Cancer  Father.  Other Problems Romie Levee, MD; 01/03/2017 4:58 PM) Back Pain  Gastroesophageal Reflux Disease  Hemorrhoids  High blood pressure  Kidney  Stone  Sleep Apnea   Vitals Tresa Endo Dockery LPN; 01/01/4009 2:72 PM) 01/03/2017 4:43 PM Weight: 226.4 lb Height: 71in Body Surface Area: 2.22 m Body Mass Index: 31.58 kg/m  Temp.: 97.9F(Oral)  Pulse: 67 (Regular)  BP: 132/88 (Sitting, Left Arm, Standard)       Physical Exam Romie Levee MD; 01/03/2017 4:56 PM) General Mental Status-Alert. General Appearance-Consistent with stated age. Hydration-Well hydrated. Voice-Normal.  Head and Neck Head-normocephalic, atraumatic with no lesions or palpable masses. Trachea-midline.  Eye Eyeball - Bilateral-Extraocular movements intact. Sclera/Conjunctiva - Bilateral-No scleral icterus.  Chest and Lung Exam Chest and lung exam reveals -quiet, even and easy respiratory effort with no use of accessory muscles and on auscultation, normal breath sounds, no adventitious sounds and normal vocal resonance. Inspection Chest Wall - Normal. Back - normal.  Cardiovascular Cardiovascular examination reveals -normal heart sounds, regular rate and rhythm with no murmurs and normal pedal pulses bilaterally.  Abdomen Inspection Inspection of the abdomen reveals - No Hernias. Palpation/Percussion Palpation and Percussion of the abdomen reveal - Soft, Non Tender, No Rebound tenderness, No Rigidity (guarding) and No hepatosplenomegaly.  Rectal Anorectal Exam External - skin tag. Internal - internal hemorrhoids.  Neurologic Neurologic evaluation reveals -alert and oriented x 3 with no impairment of recent or remote memory. Mental Status-Normal.  Musculoskeletal Global Assessment -Note: no gross deformities.  Normal Exam - Left-Upper Extremity Strength Normal and Lower Extremity Strength Normal. Normal Exam - Right-Upper Extremity Strength Normal and Lower Extremity Strength Normal.   Results Romie Levee MD; 01/03/2017 4:58 PM) Procedures  Name Value Date ANOSCOPY, DIAGNOSTIC (53664) [  Hemorrhoids ] Procedure Other: Procedure: Anoscopy Surgeon: Maisie Fus After the risks and benefits were explained, verbal consent was obtained for above procedure.  A medical assistant chaperone was present thoroughout the entire procedure. Anesthesia: none Diagnosis: Rectal bleeding Findings: Grade 2-3 right posterior, large internal hemorrhoid with signs of inflammation. Grade 2 right anterior hemorrhoid with signs of inflammation. Grade 1 left lateral internal hemorrhoid.  Performed: 01/03/2017 4:57 PM    Assessment & Plan Romie Levee MD; 01/03/2017 4:57 PM) BLEEDING INTERNAL HEMORRHOIDS (K64.8) Impression: 56 year old male who presents to the office for continued rectal bleeding. I last saw him approximately 1 year ago. Since that time his hemorrhoid bleeding has gotten worse. He is having blood with every bowel movement. On exam he has a large grade 2 hemorrhoid in the right anterior position and a grade 2-3 right posterior hemorrhoid. We discussed the typical success rate with hemorrhoid banding versus an operative procedure like Trans hemorrhoidal dearterialization.  He has decided to proceed with trans-hemorrhoidal dearterialization.  He understands that there is a time of postoperative pain after the surgery.  Other risks include bleeding and recurrence.

## 2017-01-24 ENCOUNTER — Encounter (HOSPITAL_BASED_OUTPATIENT_CLINIC_OR_DEPARTMENT_OTHER): Payer: Self-pay | Admitting: *Deleted

## 2017-01-24 NOTE — Progress Notes (Signed)
NPO AFTER MN.  ARRIVE AT [id number].  NEEDS ISTAT 8 AND EKG.

## 2017-01-26 ENCOUNTER — Ambulatory Visit (HOSPITAL_BASED_OUTPATIENT_CLINIC_OR_DEPARTMENT_OTHER)
Admission: RE | Admit: 2017-01-26 | Discharge: 2017-01-26 | Disposition: A | Discharge status: Home / Self Care | Payer: Medicare Other | Source: Ambulatory Visit | Attending: General Surgery | Admitting: General Surgery

## 2017-01-26 ENCOUNTER — Ambulatory Visit (HOSPITAL_BASED_OUTPATIENT_CLINIC_OR_DEPARTMENT_OTHER): Payer: Medicare Other | Admitting: Anesthesiology

## 2017-01-26 ENCOUNTER — Encounter (HOSPITAL_BASED_OUTPATIENT_CLINIC_OR_DEPARTMENT_OTHER)
Admission: RE | Disposition: A | Discharge status: Home / Self Care | Payer: Self-pay | Source: Ambulatory Visit | Attending: General Surgery

## 2017-01-26 ENCOUNTER — Encounter (HOSPITAL_BASED_OUTPATIENT_CLINIC_OR_DEPARTMENT_OTHER): Payer: Self-pay

## 2017-01-26 DIAGNOSIS — Z9852 Vasectomy status: Secondary | ICD-10-CM | POA: Insufficient documentation

## 2017-01-26 DIAGNOSIS — Z8042 Family history of malignant neoplasm of prostate: Secondary | ICD-10-CM | POA: Diagnosis not present

## 2017-01-26 DIAGNOSIS — Z79899 Other long term (current) drug therapy: Secondary | ICD-10-CM | POA: Diagnosis not present

## 2017-01-26 DIAGNOSIS — A630 Anogenital (venereal) warts: Secondary | ICD-10-CM | POA: Insufficient documentation

## 2017-01-26 DIAGNOSIS — K219 Gastro-esophageal reflux disease without esophagitis: Secondary | ICD-10-CM | POA: Insufficient documentation

## 2017-01-26 DIAGNOSIS — K625 Hemorrhage of anus and rectum: Secondary | ICD-10-CM | POA: Diagnosis not present

## 2017-01-26 DIAGNOSIS — K641 Second degree hemorrhoids: Secondary | ICD-10-CM | POA: Diagnosis not present

## 2017-01-26 DIAGNOSIS — Z9889 Other specified postprocedural states: Secondary | ICD-10-CM | POA: Insufficient documentation

## 2017-01-26 DIAGNOSIS — K642 Third degree hemorrhoids: Secondary | ICD-10-CM | POA: Insufficient documentation

## 2017-01-26 DIAGNOSIS — Z8249 Family history of ischemic heart disease and other diseases of the circulatory system: Secondary | ICD-10-CM | POA: Insufficient documentation

## 2017-01-26 DIAGNOSIS — Z87442 Personal history of urinary calculi: Secondary | ICD-10-CM | POA: Insufficient documentation

## 2017-01-26 DIAGNOSIS — Z8261 Family history of arthritis: Secondary | ICD-10-CM | POA: Diagnosis not present

## 2017-01-26 DIAGNOSIS — Z885 Allergy status to narcotic agent status: Secondary | ICD-10-CM | POA: Insufficient documentation

## 2017-01-26 DIAGNOSIS — G4730 Sleep apnea, unspecified: Secondary | ICD-10-CM | POA: Insufficient documentation

## 2017-01-26 DIAGNOSIS — I10 Essential (primary) hypertension: Secondary | ICD-10-CM | POA: Diagnosis not present

## 2017-01-26 HISTORY — DX: Hyperlipidemia, unspecified: E78.5

## 2017-01-26 HISTORY — DX: Other hemorrhoids: K64.8

## 2017-01-26 HISTORY — DX: Gastro-esophageal reflux disease without esophagitis: K21.9

## 2017-01-26 HISTORY — PX: TRANSANAL HEMORRHOIDAL DEARTERIALIZATION: SHX6136

## 2017-01-26 HISTORY — DX: Personal history of urinary calculi: Z87.442

## 2017-01-26 HISTORY — DX: Obstructive sleep apnea (adult) (pediatric): G47.33

## 2017-01-26 LAB — POCT I-STAT 4, (NA,K, GLUC, HGB,HCT)
Glucose, Bld: 101 mg/dL — ABNORMAL HIGH (ref 65–99)
HEMATOCRIT: 35 % — AB (ref 39.0–52.0)
Hemoglobin: 11.9 g/dL — ABNORMAL LOW (ref 13.0–17.0)
POTASSIUM: 3.3 mmol/L — AB (ref 3.5–5.1)
SODIUM: 142 mmol/L (ref 135–145)

## 2017-01-26 SURGERY — TRANSANAL HEMORRHOIDAL DEARTERIALIZATION
Anesthesia: Monitor Anesthesia Care

## 2017-01-26 MED ORDER — ACETAMINOPHEN 325 MG PO TABS
650.0000 mg | ORAL_TABLET | ORAL | Status: DC | PRN
  Filled 2017-01-26: qty 2

## 2017-01-26 MED ORDER — OXYCODONE HCL 5 MG PO TABS
5.0000 mg | ORAL_TABLET | Freq: Once | ORAL | Status: AC | PRN
  Administered 2017-01-26: 5 mg via ORAL
  Filled 2017-01-26: qty 1

## 2017-01-26 MED ORDER — ACETAMINOPHEN 650 MG RE SUPP
650.0000 mg | RECTAL | Status: DC | PRN
  Filled 2017-01-26: qty 1

## 2017-01-26 MED ORDER — OXYCODONE HCL 5 MG/5ML PO SOLN
5.0000 mg | Freq: Once | ORAL | Status: AC | PRN
Start: 2017-01-26 — End: 2017-01-26
  Filled 2017-01-26: qty 5

## 2017-01-26 MED ORDER — DIAZEPAM 5 MG PO TABS
5.0000 mg | ORAL_TABLET | Freq: Once | ORAL | Status: AC
  Administered 2017-01-26: 5 mg via ORAL
  Filled 2017-01-26: qty 1

## 2017-01-26 MED ORDER — OXYCODONE HCL 5 MG PO TABS
5.0000 mg | ORAL_TABLET | ORAL | Status: DC | PRN
  Filled 2017-01-26: qty 2

## 2017-01-26 MED ORDER — PROPOFOL 500 MG/50ML IV EMUL
INTRAVENOUS | Status: AC
  Filled 2017-01-26: qty 50

## 2017-01-26 MED ORDER — PROPOFOL 10 MG/ML IV BOLUS
INTRAVENOUS | Status: DC | PRN
Start: 2017-01-26 — End: 2017-01-26
  Administered 2017-01-26: 20 mg via INTRAVENOUS
  Administered 2017-01-26: 30 mg via INTRAVENOUS

## 2017-01-26 MED ORDER — LIDOCAINE 2% (20 MG/ML) 5 ML SYRINGE
INTRAMUSCULAR | Status: AC
  Filled 2017-01-26: qty 5

## 2017-01-26 MED ORDER — ONDANSETRON HCL 4 MG/2ML IJ SOLN
4.0000 mg | Freq: Four times a day (QID) | INTRAMUSCULAR | Status: DC | PRN
  Filled 2017-01-26: qty 2

## 2017-01-26 MED ORDER — FENTANYL CITRATE (PF) 100 MCG/2ML IJ SOLN
INTRAMUSCULAR | Status: AC
  Filled 2017-01-26: qty 2

## 2017-01-26 MED ORDER — PROPOFOL 500 MG/50ML IV EMUL
INTRAVENOUS | Status: DC | PRN
  Administered 2017-01-26: 75 ug/kg/min via INTRAVENOUS

## 2017-01-26 MED ORDER — SODIUM CHLORIDE 0.9 % IV SOLN
250.0000 mL | INTRAVENOUS | Status: DC | PRN
  Filled 2017-01-26: qty 250

## 2017-01-26 MED ORDER — MIDAZOLAM HCL 5 MG/5ML IJ SOLN
INTRAMUSCULAR | Status: DC | PRN
  Administered 2017-01-26 (×2): 2 mg via INTRAVENOUS

## 2017-01-26 MED ORDER — DIAZEPAM 5 MG PO TABS
ORAL_TABLET | ORAL | Status: AC
  Filled 2017-01-26: qty 1

## 2017-01-26 MED ORDER — LACTATED RINGERS IV SOLN
INTRAVENOUS | Status: DC
  Administered 2017-01-26: 09:00:00 via INTRAVENOUS
  Filled 2017-01-26: qty 1000

## 2017-01-26 MED ORDER — SODIUM CHLORIDE 0.9% FLUSH
3.0000 mL | Freq: Two times a day (BID) | INTRAVENOUS | Status: DC
  Filled 2017-01-26: qty 3

## 2017-01-26 MED ORDER — SODIUM CHLORIDE 0.9% FLUSH
3.0000 mL | INTRAVENOUS | Status: DC | PRN
  Filled 2017-01-26: qty 3

## 2017-01-26 MED ORDER — FENTANYL CITRATE (PF) 100 MCG/2ML IJ SOLN
25.0000 ug | INTRAMUSCULAR | Status: DC | PRN
  Administered 2017-01-26 (×2): 50 ug via INTRAVENOUS
  Filled 2017-01-26: qty 1

## 2017-01-26 MED ORDER — OXYCODONE-ACETAMINOPHEN 5-325 MG PO TABS: 1.0000 | ORAL_TABLET | ORAL | 0 refills | Status: DC | PRN

## 2017-01-26 MED ORDER — DIAZEPAM 5 MG PO TABS: 5.0000 mg | ORAL_TABLET | Freq: Four times a day (QID) | ORAL | 0 refills | Status: AC | PRN

## 2017-01-26 MED ORDER — MIDAZOLAM HCL 2 MG/2ML IJ SOLN
INTRAMUSCULAR | Status: AC
  Filled 2017-01-26: qty 4

## 2017-01-26 MED ORDER — HEMOSTATIC AGENTS (NO CHARGE) OPTIME
TOPICAL | Status: DC | PRN
  Administered 2017-01-26: 1 via TOPICAL

## 2017-01-26 MED ORDER — OXYCODONE HCL 5 MG PO TABS
ORAL_TABLET | ORAL | Status: AC
  Filled 2017-01-26: qty 1

## 2017-01-26 MED ORDER — FENTANYL CITRATE (PF) 100 MCG/2ML IJ SOLN
INTRAMUSCULAR | Status: DC | PRN
  Administered 2017-01-26: 100 ug via INTRAVENOUS

## 2017-01-26 MED FILL — diazePAM 5 MG TABS: 5 | 7 days supply | Qty: 30 | Fill #0

## 2017-01-26 MED FILL — OXYCODONE-ACETAMINOPHEN 5-3: 5-325 | 3 days supply | Qty: 30 | Fill #0

## 2017-01-26 SURGICAL SUPPLY — 35 items
BLADE HEX COATED 2.75 (ELECTRODE) IMPLANT
BRIEF STRETCH FOR OB PAD LRG (UNDERPADS AND DIAPERS) IMPLANT
COVER BACK TABLE 60X90IN (DRAPES) ×2 IMPLANT
DECANTER SPIKE VIAL GLASS SM (MISCELLANEOUS) IMPLANT
DRAPE LG THREE QUARTER DISP (DRAPES) ×2 IMPLANT
DRAPE UNDERBUTTOCKS STRL (DRAPE) ×2 IMPLANT
DRSG PAD ABDOMINAL 8X10 ST (GAUZE/BANDAGES/DRESSINGS) ×2 IMPLANT
ELECT REM PT RETURN 9FT ADLT (ELECTROSURGICAL) ×2
ELECTRODE REM PT RTRN 9FT ADLT (ELECTROSURGICAL) ×1 IMPLANT
GAUZE SPONGE 4X4 12PLY STRL (GAUZE/BANDAGES/DRESSINGS) IMPLANT
GAUZE SPONGE 4X4 12PLY STRL LF (GAUZE/BANDAGES/DRESSINGS) ×2 IMPLANT
GAUZE SPONGE 4X4 16PLY XRAY LF (GAUZE/BANDAGES/DRESSINGS) IMPLANT
GLOVE BIO SURGEON STRL SZ 6.5 (GLOVE) ×2 IMPLANT
GOWN STRL REUS W/TWL 2XL LVL3 (GOWN DISPOSABLE) ×2 IMPLANT
GOWN STRL REUS W/TWL XL LVL3 (GOWN DISPOSABLE) ×2 IMPLANT
HEMOSTAT SURGICEL 4X8 (HEMOSTASIS) IMPLANT
KIT RM TURNOVER CYSTO AR (KITS) ×2 IMPLANT
KIT SLIDE ONE PROLAPS HEMORR (KITS) ×2 IMPLANT
LEGGING LITHOTOMY PAIR STRL (DRAPES) ×2 IMPLANT
LUBRICANT JELLY K Y 4OZ (MISCELLANEOUS) ×2 IMPLANT
NEEDLE HYPO 22GX1.5 SAFETY (NEEDLE) ×2 IMPLANT
PACK BASIN DAY SURGERY FS (CUSTOM PROCEDURE TRAY) ×2 IMPLANT
PAD ARMBOARD 7.5X6 YLW CONV (MISCELLANEOUS) IMPLANT
PENCIL BUTTON HOLSTER BLD 10FT (ELECTRODE) IMPLANT
SPONGE HEMORRHOID 8X3CM (HEMOSTASIS) ×2 IMPLANT
SUT CHROMIC 2 0 SH (SUTURE) IMPLANT
SUT CHROMIC 3 0 SH 27 (SUTURE) IMPLANT
SUT VIC AB 2-0 UR6 27 (SUTURE) IMPLANT
SYR CONTROL 10ML LL (SYRINGE) ×2 IMPLANT
SYRINGE 20CC LL (MISCELLANEOUS) IMPLANT
TOWEL OR 17X24 6PK STRL BLUE (TOWEL DISPOSABLE) ×2 IMPLANT
TOWEL OR NON WOVEN STRL DISP B (DISPOSABLE) ×2 IMPLANT
TRAY DSU PREP LF (CUSTOM PROCEDURE TRAY) ×2 IMPLANT
TUBE CONNECTING 12X1/4 (SUCTIONS) ×2 IMPLANT
YANKAUER SUCT BULB TIP NO VENT (SUCTIONS) ×2 IMPLANT

## 2017-01-26 NOTE — Op Note (Signed)
01/26/2017  10:48 AM  PATIENT:  Don Morgan  56 y.o. male  Patient Care Team: No Pcp Per Patient as PCP - General (General Practice)  PRE-OPERATIVE DIAGNOSIS:  bleeding internal hemorrhoids  POST-OPERATIVE DIAGNOSIS:  bleeding internal hemorrhoids  PROCEDURE:  TRANSANAL HEMORRHOIDAL DEARTERIALIZATION  SURGEON:  Surgeon(s): Leighton Ruff, MD  ASSISTANT: none   ANESTHESIA:   local and MAC  SPECIMEN:  No Specimen  DISPOSITION OF SPECIMEN:  N/A  COUNTS:  YES  PLAN OF CARE: Discharge to home after PACU  PATIENT DISPOSITION:  PACU - hemodynamically stable.  INDICATION: 56 y.o. M with rectal bleeding and grade 3 internal hemorrhoids   OR FINDINGS: grade 3 R anterior and posterior hemorrhoids.  Grade 2 left lateral  DESCRIPTION: the patient was identified in the preoperative holding area and taken to the OR where they were laid on the operating room table.  MAC anesthesia was induced without difficulty. The patient was then positioned in prone jackknife position with buttocks gently taped apart.  The patient was then prepped and draped in usual sterile fashion.  SCDs were noted to be in place prior to the initiation of anesthesia. A surgical timeout was performed indicating the correct patient, procedure, positioning and need for preoperative antibiotics.  A rectal block was performed using Marcaine with epinephrine.    I began with a digital rectal exam.  The hemorrhoids were palpated.  I then placed a Hill-Ferguson anoscope into the anal canal and evaluated this completely.  There were a grade 3 R anterior and posterior hemorrhoids.  There was a grade 2 left lateral internal hemorrhoid.    I switched over to the [facility] fiberoptically lit Doppler anocope.   Using the Doppler on the tip of the [place] anoscope, I identified the arterial hemorrhoidal vessels coming in in the classic hexagonal anatomical pattern  (right posterior/lateral/anterior, left posterior /lateral/anterior).    I  proceeded to ligate the hemorrhoidal arteries. I used a 2-0 Vicryl suture on a UR-6 needle in a figure-of-eight fashion over the signal around 6 cm proximal to the anal verge. I then ran that stitch longitudinally more distally to the white line of [place]. I then tied that stitch down to cause a hemorrhoidopexy. I did that for all 6 locations.    I redid Doppler anoscopy. I Identified a signal at the right posterio-medial location.  I isolated and ligated this with a figure-of-eight stitch. Signals went away.  At completion of this, all hemorrhoids were reduced into the rectum.  There is no more prolapse.  I repeated anoscopy and examination.   Hemostasis was good. I placed a soft Gelfoam cylinder into the rectum. Patient is being extubated go to recovery room.  I am about to discuss the patient's status to the family.

## 2017-01-26 NOTE — H&P (View-Only) (Signed)
History of Present Illness Romie Levee MD; 01/03/2017 4:56 PM) The patient is a 56 year old male who presents with a complaint of Rectal bleeding. 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 Bosie Clos. He underwent a colonoscopy in February 2015 which showed no abnormalities except for internal hemorrhoids which were medium in size. Externally anal condyloma was noted. Patient states that he developed an episode of severe rectal bleeding that lasted approximately 3 weeks. He called Dr. Marge Duncans office and was prescribed a hemorrhoid cream. He states that he did use some of the cream but his bleeding worsened and he stopped. I saw him in the office in 2016 and recommended hemorrhoid suppositories. He did not get this filled due to cost. Since then he continues to have rectal bleeding with bowel movements. It is becoming rather severe. He has stopped his aspirin therapy.   Problem List/Past Medical Romie Levee, MD; 01/03/2017 4:58 PM) BLEEDING INTERNAL HEMORRHOIDS (K64.8)   Past Surgical History Romie Levee, MD; 01/03/2017 4:58 PM) Spinal Surgery - Neck  Vasectomy   Diagnostic Studies History Romie Levee, MD; 01/03/2017 4:58 PM) Colonoscopy  within last year  Allergies Michel Bickers, LPN; 9/81/1914 7:82 PM) Morphine Derivatives  Hives.  Medication History Romie Levee, MD; 01/03/2017 4:58 PM) Lisinopril-Hydrochlorothiazide (20-12.5MG  Tablet, Oral) Active.  Social History Romie Levee, MD; 01/03/2017 4:58 PM) Alcohol use  Occasional alcohol use. Caffeine use  Coffee, Tea. No drug use  Tobacco use  Never smoker.  Family History Romie Levee, MD; 01/03/2017 4:58 PM) Arthritis  Family Members In General. Cancer  Father. Hypertension  Mother. Prostate Cancer  Father.  Other Problems Romie Levee, MD; 01/03/2017 4:58 PM) Back Pain  Gastroesophageal Reflux Disease  Hemorrhoids  High blood pressure  Kidney  Stone  Sleep Apnea   Vitals Tresa Endo Dockery LPN; 9/56/2130 8:65 PM) 01/03/2017 4:43 PM Weight: 226.4 lb Height: 71in Body Surface Area: 2.22 m Body Mass Index: 31.58 kg/m  Temp.: 97.19F(Oral)  Pulse: 67 (Regular)  BP: 132/88 (Sitting, Left Arm, Standard)       Physical Exam Romie Levee MD; 01/03/2017 4:56 PM) General Mental Status-Alert. General Appearance-Consistent with stated age. Hydration-Well hydrated. Voice-Normal.  Head and Neck Head-normocephalic, atraumatic with no lesions or palpable masses. Trachea-midline.  Eye Eyeball - Bilateral-Extraocular movements intact. Sclera/Conjunctiva - Bilateral-No scleral icterus.  Chest and Lung Exam Chest and lung exam reveals -quiet, even and easy respiratory effort with no use of accessory muscles and on auscultation, normal breath sounds, no adventitious sounds and normal vocal resonance. Inspection Chest Wall - Normal. Back - normal.  Cardiovascular Cardiovascular examination reveals -normal heart sounds, regular rate and rhythm with no murmurs and normal pedal pulses bilaterally.  Abdomen Inspection Inspection of the abdomen reveals - No Hernias. Palpation/Percussion Palpation and Percussion of the abdomen reveal - Soft, Non Tender, No Rebound tenderness, No Rigidity (guarding) and No hepatosplenomegaly.  Rectal Anorectal Exam External - skin tag. Internal - internal hemorrhoids.  Neurologic Neurologic evaluation reveals -alert and oriented x 3 with no impairment of recent or remote memory. Mental Status-Normal.  Musculoskeletal Global Assessment -Note: no gross deformities.  Normal Exam - Left-Upper Extremity Strength Normal and Lower Extremity Strength Normal. Normal Exam - Right-Upper Extremity Strength Normal and Lower Extremity Strength Normal.   Results Romie Levee MD; 01/03/2017 4:58 PM) Procedures  Name Value Date ANOSCOPY, DIAGNOSTIC (78469) [  Hemorrhoids ] Procedure Other: Procedure: Anoscopy Surgeon: Maisie Fus After the risks and benefits were explained, verbal consent was obtained for above procedure.  A medical assistant chaperone was present thoroughout the entire procedure. Anesthesia: none Diagnosis: Rectal bleeding Findings: Grade 2-3 right posterior, large internal hemorrhoid with signs of inflammation. Grade 2 right anterior hemorrhoid with signs of inflammation. Grade 1 left lateral internal hemorrhoid.  Performed: 01/03/2017 4:57 PM    Assessment & Plan Romie Levee MD; 01/03/2017 4:57 PM) BLEEDING INTERNAL HEMORRHOIDS (K64.8) Impression: 56 year old male who presents to the office for continued rectal bleeding. I last saw him approximately 1 year ago. Since that time his hemorrhoid bleeding has gotten worse. He is having blood with every bowel movement. On exam he has a large grade 2 hemorrhoid in the right anterior position and a grade 2-3 right posterior hemorrhoid. We discussed the typical success rate with hemorrhoid banding versus an operative procedure like Trans hemorrhoidal dearterialization.  He has decided to proceed with trans-hemorrhoidal dearterialization.  He understands that there is a time of postoperative pain after the surgery.  Other risks include bleeding and recurrence.

## 2017-01-26 NOTE — Transfer of Care (Signed)
Immediate Anesthesia Transfer of Care Note  Patient: Don Morgan  Procedure(s) Performed: Procedure(s): TRANSANAL HEMORRHOIDAL DEARTERIALIZATION (N/A)  Patient Location: PACU  Anesthesia Type:MAC   Level of Consciousness: awake, alert  and oriented  Airway & Oxygen Therapy: Patient Spontanous Breathing and Patient connected to nasal cannula oxygen  Post-op Assessment: Report given to RN  Post vital signs: Reviewed and stable  Last Vitals: 143/93, 73, 16, 100% Vitals:   01/26/17 0700  BP: (!) 129/93  Pulse: 63  Resp: 19  Temp: 36.7 C    Last Pain:  Vitals:   01/26/17 0700  TempSrc: Oral      Patients Stated Pain Goal: 5 (85/88/50 2774)  Complications: No apparent anesthesia complications

## 2017-01-26 NOTE — Anesthesia Preprocedure Evaluation (Signed)
Anesthesia Evaluation  Patient identified by MRN, date of birth, ID band Patient awake    Reviewed: Allergy & Precautions, NPO status , Patient's Chart, lab work & pertinent test results  Airway Mallampati: II   Neck ROM: full    Dental   Pulmonary sleep apnea ,    breath sounds clear to auscultation       Cardiovascular hypertension,  Rhythm:regular Rate:Normal     Neuro/Psych    GI/Hepatic GERD  ,  Endo/Other    Renal/GU      Musculoskeletal   Abdominal   Peds  Hematology   Anesthesia Other Findings   Reproductive/Obstetrics                             Anesthesia Physical Anesthesia Plan  ASA: II  Anesthesia Plan: MAC   Post-op Pain Management:    Induction: Intravenous  Airway Management Planned: Simple Face Mask  Additional Equipment:   Intra-op Plan:   Post-operative Plan:   Informed Consent: I have reviewed the patients History and Physical, chart, labs and discussed the procedure including the risks, benefits and alternatives for the proposed anesthesia with the patient or authorized representative who has indicated his/her understanding and acceptance.     Plan Discussed with: CRNA, Anesthesiologist and Surgeon  Anesthesia Plan Comments:         Anesthesia Quick Evaluation

## 2017-01-26 NOTE — OR Nursing (Signed)
Up to bathroom to urinate with no results. Very uncomfortable and lower abdomen tender to touch. Bladder scan completed showing 538m in bladder. Dr. TMarcello Mooresmade aware. In and out cath done with return of 6035myellow urine. Teaching for self cath also completed during the process per order of Dr.Thomas. Patient to go home with a self cath kit.

## 2017-01-26 NOTE — Progress Notes (Signed)
Up to bathroom for second trip to void. Unsuccessful. Dr. Maisie Fus aware. Additional orders given.

## 2017-01-26 NOTE — Interval H&P Note (Signed)
History and Physical Interval Note:  01/26/2017 9:28 AM  Don Morgan [name]  has presented today for surgery, with the diagnosis of bleeding internal hemorrhoids  The various methods of treatment have been discussed with the patient and family. After consideration of risks, benefits and other options for treatment, the patient has consented to  Procedure(s): TRANSANAL HEMORRHOIDAL DEARTERIALIZATION (N/A) as a surgical intervention .  The patient's history has been reviewed, patient examined, no change in status, stable for surgery.  I have reviewed the patient's chart and labs.  Questions were answered to the patient's satisfaction.     Vanita Panda, MD  Colorectal and General Surgery [facility]

## 2017-01-26 NOTE — Anesthesia Procedure Notes (Signed)
Performed by: Samanta Gal T Airway Equipment and Method: Oral airway       

## 2017-01-26 NOTE — Discharge Instructions (Addendum)
°Post Anesthesia Home Care Instructions ° °Activity: °Get plenty of rest for the remainder of the day. A responsible individual must stay with you for 24 hours following the procedure.  °For the next 24 hours, DO NOT: °-Drive a car °-Operate machinery °-Drink alcoholic beverages °-Take any medication unless instructed by your physician °-Make any legal decisions or sign important papers. ° °Meals: °Start with liquid foods such as gelatin or soup. Progress to regular foods as tolerated. Avoid greasy, spicy, heavy foods. If nausea and/or vomiting occur, drink only clear liquids until the nausea and/or vomiting subsides. Call your physician if vomiting continues. ° °Special Instructions/Symptoms: °Your throat may feel dry or sore from the anesthesia or the breathing tube placed in your throat during surgery. If this causes discomfort, gargle with warm salt water. The discomfort should disappear within 24 hours. ° °If you had a scopolamine patch placed behind your ear for the management of post- operative nausea and/or vomiting: ° °1. The medication in the patch is effective for 72 hours, after which it should be removed.  Wrap patch in a tissue and discard in the trash. Wash hands thoroughly with soap and water. °2. You may remove the patch earlier than 72 hours if you experience unpleasant side effects which may include dry mouth, dizziness or visual disturbances. °3. Avoid touching the patch. Wash your hands with soap and water after contact with the patch. °  °ANORECTAL SURGERY: POST OP INSTRUCTIONS °1. Take your usually prescribed home medications unless otherwise directed. °2. DIET: During the first few hours after surgery sip on some liquids until you are able to urinate.  It is normal to not urinate for several hours after this surgery.  If you feel uncomfortable, please contact the office for instructions.  After you are able to urinate,you may eat, if you feel like it.  Follow a light bland diet the first 24  hours after arrival home, such as soup, liquids, crackers, etc.  Be sure to include lots of fluids daily (6-8 glasses).  Avoid fast food or heavy meals, as your are more likely to get nauseated.  Eat a low fat diet the next few days after surgery.  Limit caffeine intake to 1-2 servings a day. °3. PAIN CONTROL: °a. Pain is best controlled by a usual combination of several different methods TOGETHER: °i. Muscle relaxation °1.  Soak in a warm bath (or Sitz bath) three times a day and after bowel movements.  Continue to do this until all pain is resolved. °2. Take the muscle relaxer (Valium) every 6 hours for the first 2 days after surgery  °ii. Over the counter pain medication °iii. Prescription pain medication °b. Most patients will experience some swelling and discomfort in the anus/rectal area and incisions.  Heat such as warm towels, sitz baths, warm baths, etc to help relax tight/sore spots and speed recovery.  Some people prefer to use ice, especially in the first couple days after surgery, as it may decrease the pain and swelling, or alternate between ice & heat.  Experiment to what works for you.  Swelling and bruising can take several weeks to resolve.  Pain can take even longer to completely resolve. °c. It is helpful to take an over-the-counter pain medication regularly for the first few weeks.  Choose one of the following that works best for you: °i. Naproxen (Aleve, etc)  Two 220mg tabs twice a day °ii. Ibuprofen (Advil, etc) Three 200mg tabs four times a day (every meal & bedtime) °  d. A  prescription for pain medication (such as percocet, oxycodone, hydrocodone, etc) should be given to you upon discharge.  Take your pain medication as prescribed.  °i. If you are having problems/concerns with the prescription medicine (does not control pain, nausea, vomiting, rash, itching, etc), please call us [phone] to see if we need to switch you to a different pain medicine that will work better for you and/or  control your side effect better. °ii. If you need a refill on your pain medication, please contact your pharmacy.  They will contact our office to request authorization. Prescriptions will not be filled after 5 pm or on week-ends. °4. KEEP YOUR BOWELS REGULAR and AVOID CONSTIPATION °a. The goal is one to two soft bowel movements a day.  You should at least have a bowel movement every other day. °b. Avoid getting constipated.  Between the surgery and the pain medications, it is common to experience some constipation. This can be very painful after rectal surgery.  Increasing fluid intake and taking a fiber supplement (such as Metamucil, Citrucel, FiberCon, etc) 1-2 times a day regularly will usually help prevent this problem from occurring.  A stool softener like colace is also recommended.  This can be purchased over the counter at your pharmacy.  You can take it up to 3 times a day.  If you do not have a bowel movement after 24 hrs since your surgery, take one does of milk of magnesia.  If you still haven't had a bowel movement 8-12 hours after that dose, take another dose.  If you don't have a bowel movement 48 hrs after surgery, purchase a Fleets enema from the drug store and administer gently per package instructions.  If you still are having trouble with your bowel movements after that, please call the office for further instructions. °c. If you develop diarrhea or have many loose bowel movements, simplify your diet to bland foods & liquids for a few days.  Stop any stool softeners and decrease your fiber supplement.  Switching to mild anti-diarrheal medications (Kayopectate, Pepto Bismol) can help.  If this worsens or does not improve, please call us. ° °5. Wound Care °a. Remove your bandages before your first bowel movement or 8 hours after surgery.     °b. Remove any wound packing material at this tim,e as well.  You do not need to repack the wound unless instructed otherwise.  Wear an absorbent pad or soft  cotton gauze in your underwear to catch any drainage and help keep the area clean. You should change this every 2-3 hours while awake. °c. Keep the area clean and dry.  Bathe / shower every day, especially after bowel movements.  Keep the area clean by showering / bathing over the incision / wound.   It is okay to soak an open wound to help wash it.  Wet wipes or showers / gentle washing after bowel movements is often less traumatic than regular toilet paper. °d. You may have some styrofoam-like soft packing in the rectum which will come out with the first bowel movement.  °e. You will often notice bleeding with bowel movements.  This should slow down by the end of the first week of surgery °f. Expect some drainage.  This should slow down, too, by the end of the first week of surgery.  Wear an absorbent pad or soft cotton gauze in your underwear until the drainage stops. °g. Do Not sit on a rubber or pillow ring.    This can make you symptoms worse.  You may sit on a soft pillow if needed.  °6. ACTIVITIES as tolerated:   °a. You may resume regular (light) daily activities beginning the next day--such as daily self-care, walking, climbing stairs--gradually increasing activities as tolerated.  If you can walk 30 minutes without difficulty, it is safe to try more intense activity such as jogging, treadmill, bicycling, low-impact aerobics, swimming, etc. °b. Save the most intensive and strenuous activity for last such as sit-ups, heavy lifting, contact sports, etc  Refrain from any heavy lifting or straining until you are off narcotics for pain control.   °c. You may drive when you are no longer taking prescription pain medication, you can comfortably sit for long periods of time, and you can safely maneuver your car and apply brakes. °d. You may have sexual intercourse when it is comfortable.  °7. FOLLOW UP in our office °a. Please call [facility] at [phone] to set up an appointment to see your surgeon in the office for  a follow-up appointment approximately 3-4 weeks after your surgery. °b. Make sure that you call for this appointment the day you arrive home to insure a convenient appointment time. °10. IF YOU HAVE DISABILITY OR FAMILY LEAVE FORMS, BRING THEM TO THE OFFICE FOR PROCESSING.  DO NOT GIVE THEM TO YOUR DOCTOR. ° ° ° ° °WHEN TO CALL US [phone]: °1. Poor pain control °2. Reactions / problems with new medications (rash/itching, nausea, etc)  °3. Fever over 101.5 F (38.5 C) °4. Inability to urinate °5. Nausea and/or vomiting °6. Worsening swelling or bruising °7. Continued bleeding from incision. °8. Increased pain, redness, or drainage from the incision ° °The clinic staff is available to answer your questions during regular business hours (8:30am-5pm).  Please don’t hesitate to call and ask to speak to one of our nurses for clinical concerns.   A surgeon from [facility] is always on call at the hospitals °  °If you have a medical emergency, go to the nearest emergency room or call 911. °  ° °[facility] °[address], [address], [place], [place]  [address] ? °MAIN: [phone] ? TOLL FREE: [phone] ? °FAX [phone] °[email or web address] ° ° ° °

## 2017-01-26 NOTE — Anesthesia Postprocedure Evaluation (Signed)
Anesthesia Post Note  Patient: Audree Camel Mechling  Procedure(s) Performed: Procedure(s) (LRB): TRANSANAL HEMORRHOIDAL DEARTERIALIZATION (N/A)  Patient location during evaluation: PACU Anesthesia Type: MAC Level of consciousness: awake and alert Pain management: pain level controlled Vital Signs Assessment: post-procedure vital signs reviewed and stable Respiratory status: spontaneous breathing, nonlabored ventilation, respiratory function stable and patient connected to nasal cannula oxygen Cardiovascular status: stable and blood pressure returned to baseline Anesthetic complications: no       Last Vitals:  Vitals:   01/26/17 1130 01/26/17 1145  BP: (!) 151/96 (!) 139/97  Pulse: (!) 59 70  Resp: 14 13  Temp:      Last Pain:  Vitals:   01/26/17 1130  TempSrc:   PainSc: [place]

## 2017-01-26 NOTE — Anesthesia Procedure Notes (Signed)
Procedure Name: MAC Date/Time: 01/26/2017 9:50 AM Performed by: Bethena Roys T Pre-anesthesia Checklist: Patient identified, Timeout performed, Emergency Drugs available, Suction available and Patient being monitored Patient Re-evaluated:Patient Re-evaluated prior to inductionOxygen Delivery Method: Nasal cannula

## 2017-01-27 ENCOUNTER — Encounter (HOSPITAL_BASED_OUTPATIENT_CLINIC_OR_DEPARTMENT_OTHER): Payer: Self-pay | Admitting: General Surgery

## 2017-01-29 ENCOUNTER — Emergency Department
Admission: EM | Admit: 2017-01-29 | Discharge: 2017-01-29 | Disposition: A | Discharge status: Home / Self Care | Payer: Medicare Other | Attending: Emergency Medicine | Admitting: Emergency Medicine

## 2017-01-29 DIAGNOSIS — R109 Unspecified abdominal pain: Secondary | ICD-10-CM | POA: Insufficient documentation

## 2017-01-29 DIAGNOSIS — R338 Other retention of urine: Secondary | ICD-10-CM

## 2017-01-29 DIAGNOSIS — R197 Diarrhea, unspecified: Secondary | ICD-10-CM | POA: Insufficient documentation

## 2017-01-29 DIAGNOSIS — G8918 Other acute postprocedural pain: Secondary | ICD-10-CM | POA: Insufficient documentation

## 2017-01-29 DIAGNOSIS — R339 Retention of urine, unspecified: Secondary | ICD-10-CM | POA: Diagnosis present

## 2017-01-29 LAB — COMPREHENSIVE METABOLIC PANEL
ALK PHOS: 59 U/L (ref 38–126)
ALT: 14 U/L — AB (ref 17–63)
AST: 21 U/L (ref 15–41)
Albumin: 3.8 g/dL (ref 3.5–5.0)
Anion gap: 7 (ref 5–15)
BILIRUBIN TOTAL: 0.8 mg/dL (ref 0.3–1.2)
BUN: 9 mg/dL (ref 6–20)
CALCIUM: 9 mg/dL (ref 8.9–10.3)
CO2: 30 mmol/L (ref 22–32)
CREATININE: 0.94 mg/dL (ref 0.61–1.24)
Chloride: 102 mmol/L (ref 101–111)
Glucose, Bld: 96 mg/dL (ref 65–99)
Potassium: 3.1 mmol/L — ABNORMAL LOW (ref 3.5–5.1)
Sodium: 139 mmol/L (ref 135–145)
TOTAL PROTEIN: 7.7 g/dL (ref 6.5–8.1)

## 2017-01-29 LAB — CBC WITH DIFFERENTIAL/PLATELET
Basophils Absolute: 0.1 10*3/uL (ref 0–0.1)
Basophils Relative: 1 %
Eosinophils Absolute: 0.1 10*3/uL (ref 0–0.7)
Eosinophils Relative: 1 %
HCT: 38.6 % — ABNORMAL LOW (ref 40.0–52.0)
HEMOGLOBIN: 13.4 g/dL (ref 13.0–18.0)
LYMPHS ABS: 1.4 10*3/uL (ref 1.0–3.6)
LYMPHS PCT: 14 %
MCH: 27.5 pg (ref 26.0–34.0)
MCHC: 34.6 g/dL (ref 32.0–36.0)
MCV: 79.6 fL — AB (ref 80.0–100.0)
MONOS PCT: 9 %
Monocytes Absolute: 0.9 10*3/uL (ref 0.2–1.0)
Neutro Abs: 7.5 10*3/uL — ABNORMAL HIGH (ref 1.4–6.5)
Neutrophils Relative %: 75 %
Platelets: 431 10*3/uL (ref 150–440)
RBC: 4.86 MIL/uL (ref 4.40–5.90)
RDW: 14.5 % (ref 11.5–14.5)
WBC: 10 10*3/uL (ref 3.8–10.6)

## 2017-01-29 LAB — URINALYSIS, COMPLETE (UACMP) WITH MICROSCOPIC
Bilirubin Urine: NEGATIVE
Glucose, UA: NEGATIVE mg/dL
KETONES UR: 5 mg/dL — AB
Leukocytes, UA: NEGATIVE
Nitrite: NEGATIVE
Protein, ur: NEGATIVE mg/dL
SQUAMOUS EPITHELIAL / LPF: NONE SEEN
Specific Gravity, Urine: 1.014 (ref 1.005–1.030)
pH: 7 (ref 5.0–8.0)

## 2017-01-29 MED ORDER — ONDANSETRON HCL 4 MG/2ML IJ SOLN
4.0000 mg | Freq: Once | INTRAMUSCULAR | Status: AC
  Administered 2017-01-29: 4 mg via INTRAVENOUS
  Filled 2017-01-29: qty 2

## 2017-01-29 MED ORDER — HYDROMORPHONE HCL 1 MG/ML IJ SOLN
1.0000 mg | Freq: Once | INTRAMUSCULAR | Status: AC
  Administered 2017-01-29: 1 mg via INTRAVENOUS
  Filled 2017-01-29: qty 1

## 2017-01-29 MED ORDER — SODIUM CHLORIDE 0.9 % IV SOLN
Freq: Once | INTRAVENOUS | Status: AC
  Administered 2017-01-29: 20:00:00 via INTRAVENOUS

## 2017-01-29 MED ORDER — LIDOCAINE VISCOUS 2 % MT SOLN: OROMUCOSAL | 0 refills | Status: DC

## 2017-01-29 MED ORDER — CIPROFLOXACIN HCL 250 MG PO TABS: 250.0000 mg | ORAL_TABLET | Freq: Two times a day (BID) | ORAL | 0 refills | Status: AC

## 2017-01-29 MED ORDER — OXYCODONE-ACETAMINOPHEN 7.5-325 MG PO TABS: 1.0000 | ORAL_TABLET | ORAL | 0 refills | Status: AC | PRN

## 2017-01-29 NOTE — ED Notes (Signed)
Pt assisted up to [facility] to attempt to have a BM.

## 2017-01-29 NOTE — ED Triage Notes (Signed)
EMS pt to rm 26 from home with report of difficulty urination since having hemorrhoid surgery on 01/26/17. Pt also states he feels like he is dehydrated.

## 2017-01-29 NOTE — ED Provider Notes (Addendum)
[facility] Emergency Department Provider Note       Time seen: ----------------------------------------- 7:43 PM on 01/29/2017 -----------------------------------------     I have reviewed the triage vital signs and the nursing notes.   HISTORY   Chief Complaint No chief complaint on file.    HPI Don Morgan is a 56 y.o. male who presents to the ED for inability to urinate. Patient reports having hemorrhoidal surgery on Wednesday and states that over the last 24 hours he has lost the ability to urinate. Patient last urinated today. He is also had diarrhea since the surgery. He denies fevers, chills, or vomiting. Patient states he feels dehydrated because he could not drink due to the discomfort of not being able to urinate. He denies a history of this before.   Past Medical History:  Diagnosis Date  . GERD (gastroesophageal reflux disease)   . History of kidney stones   . Hyperlipidemia   . Hypertension   . Internal hemorrhoid, bleeding   . OSA (obstructive sleep apnea)    per pt has not gone back to get fiited for cpap yet---  was recommeded cpap and wt loss    There are no active problems to display for this patient.   Past Surgical History:  Procedure Laterality Date  . ANTERIOR CERVICAL DISCECTOMY  01/08/2009   C6 -- C7  TOTAL DISK REPLACEMENT  . CARDIOVASCULAR STRESS TEST  11-07-2015   dr Juliann Pares [facility] clinic)   normal perfusion nuclear study w/ no ischemia/  normal LV function and wall motion, ef 57%  . CYSTOSCOPY/RETROGRADE/URETEROSCOPY/STONE EXTRACTION WITH BASKET  2014  . EXTRACORPOREAL SHOCK WAVE LITHOTRIPSY  2014  . TRANSANAL HEMORRHOIDAL DEARTERIALIZATION N/A 01/26/2017   Procedure: TRANSANAL HEMORRHOIDAL DEARTERIALIZATION;  Surgeon: Romie Levee, MD;  Location: [facility];  Service: General;  Laterality: N/A;  . TRANSTHORACIC ECHOCARDIOGRAM  11-07-2015   dr Juliann Pares   mild LVH,  ef 55%/  mild MR and TR/   trivial PR    Allergies Morphine and related  Social History Social History  Substance Use Topics  . Smoking status: Never Smoker  . Smokeless tobacco: Never Used  . Alcohol use No    Review of Systems Constitutional: Negative for fever. Cardiovascular: Negative for chest pain. Respiratory: Negative for shortness of breath. Gastrointestinal:Positive for abdominal discomfort, diarrhea Genitourinary: Positive for difficulty urinating Musculoskeletal: Negative for back pain. Skin: Negative for rash. Neurological: Negative for headaches, positive for weakness  10-point ROS otherwise negative.  ____________________________________________   PHYSICAL EXAM:  VITAL SIGNS: ED Triage Vitals  Enc Vitals Group     BP      Pulse      Resp      Temp      Temp src      SpO2      Weight      Height      Head Circumference      Peak Flow      Pain Score      Pain Loc      Pain Edu?      Excl. in GC?     Constitutional: Alert and oriented. Mild distress Eyes: Conjunctivae are normal. Normal extraocular movements. ENT   Head: Normocephalic and atraumatic.   Nose: No congestion/rhinnorhea.   Mouth/Throat: Mucous membranes are moist.   Neck: No stridor. Cardiovascular: Normal rate, regular rhythm. No murmurs, rubs, or gallops. Respiratory: Normal respiratory effort without tachypnea nor retractions. Breath sounds are clear and equal bilaterally.  No wheezes/rales/rhonchi. Gastrointestinal: Suprapubic tenderness and distention, normal bowel sounds Rectal: Soft external hemorrhoids, perirectal ecchymosis is noted. Otherwise benign exam Musculoskeletal: Nontender with normal range of motion in extremities. No lower extremity tenderness nor edema. Neurologic:  Normal speech and language. No gross focal neurologic deficits are appreciated.  Skin:  Skin is warm, dry and intact. No rash noted. Psychiatric: Mood and affect are normal. Speech and behavior are normal.   ___________________________________________  ED COURSE:  Pertinent labs & imaging results that were available during my care of the patient were reviewed by me and considered in my medical decision making (see chart for details). Patient presents for inability to urinate, we will assess with labs and imaging as indicated.   Procedures ____________________________________________   LABS (pertinent positives/negatives)  Labs Reviewed  CBC WITH DIFFERENTIAL/PLATELET - Abnormal; Notable for the following:       Result Value   HCT 38.6 (*)    MCV 79.6 (*)    Neutro Abs 7.5 (*)    All other components within normal limits  COMPREHENSIVE METABOLIC PANEL - Abnormal; Notable for the following:    Potassium 3.1 (*)    ALT 14 (*)    All other components within normal limits  URINALYSIS, COMPLETE (UACMP) WITH MICROSCOPIC - Abnormal; Notable for the following:    Color, Urine YELLOW (*)    APPearance CLOUDY (*)    Hgb urine dipstick SMALL (*)    Ketones, ur 5 (*)    Bacteria, UA RARE (*)    All other components within normal limits   ____________________________________________  FINAL ASSESSMENT AND PLAN  Acute urinary retention  Plan: Patient's labs and imaging were dictated above. Patient had presented for acute recurrent urinary retention likely secondary to recent surgery. Foley catheter has been placed with around a liter of urine output. We will send it for a urine culture. He'll be changed to a leg bag, he is stable for outpatient follow-up withEmily Filbertms, Priscillia Fouch E, MD   Note: This note was generated in part or whole with voice recognition software. Voice recognition is usually quite accurate but there are transcription errors that can and very often do occur. I apologize for any typographical errors that were not detected and corrected.     Emily Filbert, MD 01/29/17 2031    Emily Filbert, MD 01/29/17 2108

## 2017-02-02 ENCOUNTER — Encounter: Payer: Self-pay | Admitting: Urology

## 2017-02-02 ENCOUNTER — Ambulatory Visit (INDEPENDENT_AMBULATORY_CARE_PROVIDER_SITE_OTHER): Payer: Medicare Other | Admitting: Urology

## 2017-02-02 VITALS — BP 146/94 | HR 84 | Ht 72.0 in | Wt 215.5 lb

## 2017-02-02 DIAGNOSIS — R339 Retention of urine, unspecified: Secondary | ICD-10-CM

## 2017-02-02 MED ORDER — TAMSULOSIN HCL 0.4 MG PO CAPS: 0.4000 mg | ORAL_CAPSULE | Freq: Every day | ORAL | 0 refills | Status: AC

## 2017-02-02 NOTE — Progress Notes (Signed)
02/02/2017 11:06 AM   Don Morgan 1960/10/20 [id number]  Referring provider: No referring provider defined for this encounter.  No chief complaint on file.   HPI: 56 year old male who is status post hemorrhoidectomy one week ago. 4 days prior to his appointmedeveloped urinary retention and in the ER a Foley catheter was placed. 850 mL of urine was obtained.  The patient is tolerated the Foley catheter reasonably well. He continues to have abdominal and rectal pain. He  Used a fleets enema for the first 2 days after the surgery and had watery diarrhea, however for the last 4 or 5 days he has not had a bowel movement. He likens a lot of his pain to constipation. He is nearly out of his narcotic prescription.   Baseline, the patient does not have a history of obstructive voiding symptoms.He rarely has nocturia, has a strong stream, and feels if he empties his bladder.He does not take any medications for BPH.  The patient does have a significant family history for prostate cancer, his father passed away of prostate cancer. He recently has been followed by his primary care doctor who performed an annual DRE and PSA. He had seen Dr. Achilles Dunk in the past.     PMH: Past Medical History:  Diagnosis Date  . GERD (gastroesophageal reflux disease)   . History of kidney stones   . Hyperlipidemia   . Hypertension   . Internal hemorrhoid, bleeding   . OSA (obstructive sleep apnea)    per pt has not gone back to get fiited for cpap yet---  was recommeded cpap and wt loss    Surgical History: Past Surgical History:  Procedure Laterality Date  . ANTERIOR CERVICAL DISCECTOMY  01/08/2009   C6 -- C7  TOTAL DISK REPLACEMENT  . CARDIOVASCULAR STRESS TEST  11-07-2015   dr Juliann Pares [facility] clinic)   normal perfusion nuclear study w/ no ischemia/  normal LV function and wall motion, ef 57%  . CYSTOSCOPY/RETROGRADE/URETEROSCOPY/STONE EXTRACTION WITH BASKET  2014  . EXTRACORPOREAL SHOCK WAVE LITHOTRIPSY   2014  . TRANSANAL HEMORRHOIDAL DEARTERIALIZATION N/A 01/26/2017   Procedure: TRANSANAL HEMORRHOIDAL DEARTERIALIZATION;  Surgeon: Romie Levee, MD;  Location: [facility];  Service: General;  Laterality: N/A;  . TRANSTHORACIC ECHOCARDIOGRAM  11-07-2015   dr Juliann Pares   mild LVH,  ef 55%/  mild MR and TR/  trivial PR    Home Medications:  Allergies as of 02/02/2017      Reactions   Other    Other reaction(s): Unknown   Morphine And Related Rash      Medication List       Accurate as of 02/02/17 11:06 AM. Always use your most recent med list.          aspirin EC 81 MG tablet Take 81 mg by mouth daily.   calcium carbonate 500 MG chewable tablet Commonly known as:  TUMS - dosed in mg elemental calcium Chew 1 tablet by mouth as needed for indigestion or heartburn.   ciprofloxacin 250 MG tablet Commonly known as:  CIPRO Take 1 tablet (250 mg total) by mouth 2 (two) times daily.   diazepam 5 MG tablet Commonly known as:  VALIUM Take 1 tablet (5 mg total) by mouth every 6 (six) hours as needed (inability to urinate).   lidocaine 2 % solution Commonly known as:  XYLOCAINE Apply rectally as needed for rectal pain   lisinopril-hydrochlorothiazide 20-12.5 MG tablet Commonly known as:  PRINZIDE,ZESTORETIC Take 1 tablet by mouth  2 (two) times daily.   omeprazole 20 MG capsule Commonly known as:  PRILOSEC Take 20 mg by mouth as needed.   oxyCODONE-acetaminophen 5-325 MG tablet Commonly known as:  ROXICET Take 1-2 tablets by mouth every 4 (four) hours as needed for severe pain.   oxyCODONE-acetaminophen 7.5-325 MG tablet Commonly known as:  PERCOCET Take 1 tablet by mouth every 4 (four) hours as needed for severe pain.   tamsulosin 0.4 MG Caps capsule Commonly known as:  FLOMAX Take 1 capsule (0.4 mg total) by mouth daily.       Allergies:  Allergies  Allergen Reactions  . Other     Other reaction(s): Unknown  . Morphine And Related Rash    Family  History: Family History  Problem Relation Age of Onset  . Prostate cancer Father   . Hematuria Father     Social History:  reports that he has never smoked. He has never used smokeless tobacco. He reports that he does not drink alcohol or use drugs.  ROS: UROLOGY Frequent Urination?: No Hard to postpone urination?: No Burning/pain with urination?: Yes Get up at night to urinate?: Yes Leakage of urine?: Yes Urine stream starts and stops?: No Trouble starting stream?: Yes Do you have to strain to urinate?: Yes Blood in urine?: No Urinary tract infection?: No Sexually transmitted disease?: No Injury to kidneys or bladder?: No Painful intercourse?: No Weak stream?: No Erection problems?: No Penile pain?: No  Gastrointestinal Nausea?: No Vomiting?: No Indigestion/heartburn?: Yes Diarrhea?: Yes Constipation?: Yes  Constitutional Fever: No Night sweats?: No Weight loss?: No Fatigue?: No  Skin Skin rash/lesions?: No Itching?: No  Eyes Blurred vision?: No Double vision?: No  Ears/Nose/Throat Sore throat?: No Sinus problems?: No  Hematologic/Lymphatic Swollen glands?: No Easy bruising?: No  Cardiovascular Leg swelling?: No Chest pain?: No  Respiratory Cough?: No Shortness of breath?: No  Endocrine Excessive thirst?: No  Musculoskeletal Back pain?: No Joint pain?: No  Neurological Headaches?: No Dizziness?: No  Psychologic Depression?: No Anxiety?: No  Physical Exam: BP (!) 146/94   Pulse 84   Ht 6' (1.829 m)   Wt 97.8 kg (215 lb 8 oz)   BMI 29.23 kg/m   Constitutional:  Alert and oriented, No acute distress. HEENT: [place] AT, moist mucus membranes.  Trachea midline, no masses. Cardiovascular: No clubbing, cyanosis, or edema. Respiratory: Normal respiratory effort, no increased work of breathing. GI: Abdomen is soft, nontender, nondistended, no abdominal masses Skin: No rashes, bruises or suspicious lesions. Lymph: No cervical or inguinal  adenopathy. Neurologic: Grossly intact, no focal deficits, moving all 4 extremities. Psychiatric: Normal mood and affect.  Laboratory Data: Lab Results  Component Value Date   WBC 10.0 01/29/2017   HGB 13.4 01/29/2017   HCT 38.6 (L) 01/29/2017   MCV 79.6 (L) 01/29/2017   PLT 431 01/29/2017    Lab Results  Component Value Date   CREATININE 0.94 01/29/2017    No results found for: PSA  No results found for: TESTOSTERONE  No results found for: HGBA1C  Urinalysis    Component Value Date/Time   COLORURINE YELLOW (A) 01/29/2017 1954   APPEARANCEUR CLOUDY (A) 01/29/2017 1954   LABSPEC 1.014 01/29/2017 1954   PHURINE 7.0 01/29/2017 1954   GLUCOSEU NEGATIVE 01/29/2017 1954   HGBUR SMALL (A) 01/29/2017 1954   BILIRUBINUR NEGATIVE 01/29/2017 1954   KETONESUR 5 (A) 01/29/2017 1954   PROTEINUR NEGATIVE 01/29/2017 1954   NITRITE NEGATIVE 01/29/2017 1954   LEUKOCYTESUR NEGATIVE 01/29/2017 1954  Pertinent Imaging: 3none  Assessment & Plan:  The patient developed urinary retention after hemorrhoidectomy. I suspect that his urinary retention was secondary to constipation and narcotic usage.   The patient does not have a history of obstructive voiding symptoms, and I suspect that once he is recovered from his hemorrhoid surgery his symptoms returned and normal.  I recommended that the patient perform a Fleet Enema every 6 hours until his constipation resolves. I started the patient on Flomax, suggested he take 2 tablets today, and then 1 tablet every day following. He'll follow up with [place] in 2 dayfor a voiding trial, but only if his constipation is resolved.  There are no diagnoses linked to this encounter.  Return in about 2 years (around 02/03/2019) for Friday - [place] for voiding trial.  Crist Fat, MD  [facility] [address] [place], [place] [address] [phone]

## 2017-02-04 ENCOUNTER — Ambulatory Visit (INDEPENDENT_AMBULATORY_CARE_PROVIDER_SITE_OTHER): Payer: Medicare Other

## 2017-02-04 VITALS — BP 152/84 | HR 83 | Ht 72.0 in | Wt 214.5 lb

## 2017-02-04 DIAGNOSIS — R339 Retention of urine, unspecified: Secondary | ICD-10-CM | POA: Diagnosis not present

## 2017-02-04 NOTE — Progress Notes (Signed)
Fill and Pull Catheter Removal  Patient is present today for a catheter removal.  Patient was cleaned and prepped in a sterile fashion of sterile water/ saline was instilled into the bladder when the patient felt the urge to urinate. 10ml of water was then drained from the balloon.  A 16FR foley cath was removed from the bladder no complications were noted .  Patient as then given some time to void on their own.  Patient can void  on their own after some time.  Patient tolerated well.  Preformed by: Rupert Stacks, LPN   Follow up/ Additional notes: Per Dr. Apolinar Junes pt was given a handful of speedi cath's in case of an emergency over the weekend.   Blood pressure (!) 152/84, pulse 83, height 6' (1.829 m), weight 214 lb 8 oz (97.3 kg).

## 2020-11-07 ENCOUNTER — Other Ambulatory Visit: Payer: Self-pay | Admitting: Family Medicine

## 2020-11-07 ENCOUNTER — Other Ambulatory Visit (HOSPITAL_COMMUNITY): Payer: Self-pay | Admitting: Family Medicine

## 2020-11-07 DIAGNOSIS — R0600 Dyspnea, unspecified: Secondary | ICD-10-CM

## 2020-11-07 DIAGNOSIS — Z8249 Family history of ischemic heart disease and other diseases of the circulatory system: Secondary | ICD-10-CM

## 2020-11-07 DIAGNOSIS — R002 Palpitations: Secondary | ICD-10-CM

## 2020-11-07 DIAGNOSIS — I10 Essential (primary) hypertension: Secondary | ICD-10-CM

## 2020-11-10 ENCOUNTER — Other Ambulatory Visit: Payer: Self-pay | Admitting: Family Medicine

## 2020-11-10 DIAGNOSIS — Z8249 Family history of ischemic heart disease and other diseases of the circulatory system: Secondary | ICD-10-CM

## 2020-11-10 DIAGNOSIS — R002 Palpitations: Secondary | ICD-10-CM

## 2020-11-10 DIAGNOSIS — I10 Essential (primary) hypertension: Secondary | ICD-10-CM

## 2020-11-10 DIAGNOSIS — R0609 Other forms of dyspnea: Secondary | ICD-10-CM

## 2021-01-27 ENCOUNTER — Ambulatory Visit: Payer: Medicare Other

## 2021-05-03 NOTE — Progress Notes (Signed)
05/04/2021 9:43 AM   Don Morgan 1961/02/19 [id number]  Referring provider: [facility], [facility] [address] [place],  [place] [address]  Chief Complaint  Patient presents with   Elevated PSA    HPI: Don Morgan is a 60 y.o. male referred for evaluation of an elevated PSA.  PSA 03/06/2021 elevated at 5.2 Previous PSA levels 2.7 and 2019 and 4.0 and 2021 No bothersome LUTS Denies dysuria, gross hematuria or recurrent UTI Denies flank, abdominal or pelvic pain Family history prostate cancer in father; diagnosed in his early 56s and died of metastatic disease in his early 61s Saw Dr. Marlou Porch April 2018 for an episode of postoperative urinary retention after a hemorrhoidectomy Prior history stone disease status post SWL and ureteroscopy by Dr. Achilles Dunk in 2014 Presently is having mild right flank pain radiating to right groin region and he thinks he may be trying to pass a stone   PMH: Past Medical History:  Diagnosis Date   GERD (gastroesophageal reflux disease)    History of kidney stones    Hyperlipidemia    Hypertension    Internal hemorrhoid, bleeding    OSA (obstructive sleep apnea)    per pt has not gone back to get fiited for cpap yet---  was recommeded cpap and wt loss    Surgical History: Past Surgical History:  Procedure Laterality Date   ANTERIOR CERVICAL DISCECTOMY  01/08/2009   C6 -- C7  TOTAL DISK REPLACEMENT   CARDIOVASCULAR STRESS TEST  11-07-2015   dr Juliann Pares [facility])   normal perfusion nuclear study w/ no ischemia/  normal LV function and wall motion, ef 57%   CYSTOSCOPY/RETROGRADE/URETEROSCOPY/STONE EXTRACTION WITH BASKET  2014   EXTRACORPOREAL SHOCK WAVE LITHOTRIPSY  2014   TRANSANAL HEMORRHOIDAL DEARTERIALIZATION N/A 01/26/2017   Procedure: TRANSANAL HEMORRHOIDAL DEARTERIALIZATION;  Surgeon: Romie Levee, MD;  Location: [facility];  Service: General;  Laterality: N/A;   TRANSTHORACIC ECHOCARDIOGRAM  11-07-2015   dr  Juliann Pares   mild LVH,  ef 55%/  mild MR and TR/  trivial PR    Home Medications:  Allergies as of 05/04/2021       Reactions   Other    Other reaction(s): Unknown   Morphine And Related Rash        Medication List        Accurate as of May 04, 2021  9:43 AM. If you have any questions, ask your nurse or doctor.          STOP taking these medications    lidocaine 2 % solution Commonly known as: XYLOCAINE Stopped by: Riki Altes, MD   oxyCODONE-acetaminophen 5-325 MG tablet Commonly known as: Roxicet Stopped by: Riki Altes, MD       TAKE these medications    aspirin EC 81 MG tablet Take 81 mg by mouth daily.   atorvastatin 40 MG tablet Commonly known as: LIPITOR Take 40 mg by mouth daily as needed.   calcium carbonate 500 MG chewable tablet Commonly known as: TUMS - dosed in mg elemental calcium Chew 1 tablet by mouth as needed for indigestion or heartburn.   lisinopril-hydrochlorothiazide 20-12.5 MG tablet Commonly known as: ZESTORETIC Take 1 tablet by mouth 2 (two) times daily.   omeprazole 20 MG capsule Commonly known as: PRILOSEC Take 20 mg by mouth as needed.   sildenafil 100 MG tablet Commonly known as: VIAGRA Take by mouth.   triamcinolone ointment 0.5 % Commonly known as: KENALOG SMARTSIG:1 Topical Daily  PRN        Allergies:  Allergies  Allergen Reactions   Other     Other reaction(s): Unknown   Morphine And Related Rash    Family History: Family History  Problem Relation Age of Onset   Prostate cancer Father    Hematuria Father     Social History:  reports that he has never smoked. He has never used smokeless tobacco. He reports that he does not drink alcohol and does not use drugs.   Physical Exam: BP (!) 159/89   Pulse (!) 7   Ht 6' (1.829 m)   Wt 238 lb (108 kg)   BMI 32.28 kg/m   Constitutional:  Alert and oriented, No acute distress. HEENT: [place] AT, moist mucus membranes.  Trachea midline, no  masses. Cardiovascular: No clubbing, cyanosis, or edema. Respiratory: Normal respiratory effort, no increased work of breathing. GI: Abdomen is soft, nontender, nondistended, no abdominal masses GU: 40 g, smooth without nodules.  Upper one half not palpable Skin: No rashes, bruises or suspicious lesions. Neurologic: Grossly intact, no focal deficits, moving all 4 extremities. Psychiatric: Normal mood and affect.   Assessment & Plan:    1.  Elevated PSA Although PSA is a prostate cancer screening test he was informed that cancer is not the most common cause of an elevated PSA. Other potential causes including BPH and inflammation were discussed. He was informed that the only way to adequately diagnose prostate cancer would be a transrectal ultrasound and biopsy of the prostate. The procedure was discussed including potential risks of bleeding and infection/sepsis. He was also informed that a negative biopsy does not conclusively rule out the possibility that prostate cancer may be present and that continued monitoring is required. The use of newer adjunctive blood tests including PHI and 4kScore were discussed. The use of multiparametric prostate MRI was also discussed however is not typically used for initial evaluation of an elevated PSA. Continued periodic surveillance was also discussed. Repeat PSA today to ensure this was not a transient elevation If PSA remains elevated he will think over these options  2.  Right flank pain Prior history stone disease UA today KUB today   Riki Altes, MD  [facility] [address], [address] [place], [place] [address] [phone]

## 2021-05-04 ENCOUNTER — Ambulatory Visit
Admission: RE | Admit: 2021-05-04 | Discharge: 2021-05-04 | Disposition: A | Discharge status: Home / Self Care | Payer: Medicare Other | Attending: Urology | Admitting: Urology

## 2021-05-04 ENCOUNTER — Other Ambulatory Visit: Payer: Self-pay

## 2021-05-04 ENCOUNTER — Ambulatory Visit (INDEPENDENT_AMBULATORY_CARE_PROVIDER_SITE_OTHER): Payer: Medicare Other | Admitting: Urology

## 2021-05-04 ENCOUNTER — Ambulatory Visit
Admission: RE | Admit: 2021-05-04 | Discharge: 2021-05-04 | Disposition: A | Discharge status: Home / Self Care | Payer: Medicare Other | Source: Ambulatory Visit | Attending: Urology | Admitting: Urology

## 2021-05-04 ENCOUNTER — Encounter: Payer: Self-pay | Admitting: Urology

## 2021-05-04 VITALS — BP 159/89 | HR 7 | Ht 72.0 in | Wt 238.0 lb

## 2021-05-04 DIAGNOSIS — R109 Unspecified abdominal pain: Secondary | ICD-10-CM | POA: Insufficient documentation

## 2021-05-04 DIAGNOSIS — Z87442 Personal history of urinary calculi: Secondary | ICD-10-CM | POA: Diagnosis not present

## 2021-05-04 DIAGNOSIS — R972 Elevated prostate specific antigen [PSA]: Secondary | ICD-10-CM

## 2021-05-04 DIAGNOSIS — Z8042 Family history of malignant neoplasm of prostate: Secondary | ICD-10-CM | POA: Diagnosis not present

## 2021-05-04 LAB — URINALYSIS, COMPLETE
Bilirubin, UA: NEGATIVE
Glucose, UA: NEGATIVE
Ketones, UA: NEGATIVE
Nitrite, UA: NEGATIVE
Protein,UA: NEGATIVE
RBC, UA: NEGATIVE
Specific Gravity, UA: 1.02 (ref 1.005–1.030)
Urobilinogen, Ur: 4 mg/dL — ABNORMAL HIGH (ref 0.2–1.0)
pH, UA: 7 (ref 5.0–7.5)

## 2021-05-04 LAB — MICROSCOPIC EXAMINATION

## 2021-05-05 LAB — PSA: Prostate Specific Ag, Serum: 5.8 ng/mL — ABNORMAL HIGH (ref 0.0–4.0)

## 2021-05-07 ENCOUNTER — Telehealth: Payer: Self-pay | Admitting: Urology

## 2021-05-07 NOTE — Telephone Encounter (Signed)
Notified patient as instructed, patient pleased. Discussed follow-up appointments, patient agrees. Patient would like a prostate biopsy in office. Reviewed instruction for prostate biopsy .  Patient states he is not having in pain in his right flank .   Scheduled prostate biopsy

## 2021-05-07 NOTE — Telephone Encounter (Signed)
PSA remains elevated at 5.8.  Based on his age I would not recommend observation.  We discussed prostate biopsy versus prostate MRI.  Does he have a preference or any questions.  Abdominal x-ray was reviewed and I do not see any calcifications suspicious for right-sided stones.  There is a stone in the bottom portion of the left kidney.  If he continues to have right flank pain would recommend scheduling a stone protocol CT

## 2021-05-07 NOTE — Telephone Encounter (Signed)
Pt LM on triage line requesting results of his KUB and labs.

## 2021-05-25 ENCOUNTER — Encounter: Payer: Self-pay | Admitting: Urology

## 2021-05-25 ENCOUNTER — Other Ambulatory Visit: Payer: Self-pay

## 2021-05-25 ENCOUNTER — Ambulatory Visit (INDEPENDENT_AMBULATORY_CARE_PROVIDER_SITE_OTHER): Payer: Medicare Other | Admitting: Urology

## 2021-05-25 VITALS — BP 133/78 | HR 65 | Ht 72.0 in | Wt 238.0 lb

## 2021-05-25 DIAGNOSIS — R972 Elevated prostate specific antigen [PSA]: Secondary | ICD-10-CM | POA: Diagnosis not present

## 2021-05-25 MED ORDER — GENTAMICIN SULFATE 40 MG/ML IJ SOLN
80.0000 mg | Freq: Once | INTRAMUSCULAR | Status: AC
  Administered 2021-05-25: 80 mg via INTRAMUSCULAR

## 2021-05-25 MED ORDER — LEVOFLOXACIN 500 MG PO TABS
500.0000 mg | ORAL_TABLET | Freq: Once | ORAL | Status: AC
  Administered 2021-05-25: 500 mg via ORAL

## 2021-05-25 NOTE — Addendum Note (Signed)
Addended by: Honor Loh on: 05/25/2021 01:46 PM   Modules accepted: Orders

## 2021-05-25 NOTE — Patient Instructions (Signed)
Transrectal Ultrasound-Guided Prostate Biopsy, Care After This sheet gives you information about how to care for yourself after your procedure. Your doctor may also give you more specific instructions. If youhave problems or questions, contact your doctor. What can I expect after the procedure? After the procedure, it is common to have: Pain and discomfort in your butt, especially while sitting. Pink-colored pee (urine), due to small amounts of blood in the pee. Burning while peeing (urinating). Blood in your poop (stool). Bleeding from your butt. Blood in your semen. Follow these instructions at home: Medicines Take over-the-counter and prescription medicines only as told by your doctor. If you were prescribed antibiotic medicine, take it as told by your doctor. Do not stop taking the antibiotic even if you start to feel better. Activity  Do not drive for 24 hours if you were given a medicine to help you relax (sedative) during your procedure. Return to your normal activities as told by your doctor. Ask your doctor what activities are safe for you. Ask your doctor when it is okay for you to have sex. Do not lift anything that is heavier than 10 lb (4.5 kg), or the limit that you are told, until your doctor says that it is safe.  General instructions  Drink enough water to keep your pee pale yellow. Watch your pee, poop, and semen for new bleeding or bleeding that gets worse. Keep all follow-up visits as told by your doctor. This is important.  Contact a doctor if you: Have blood clots in your pee or poop. Notice that your pee smells bad or unusual. Have very bad belly pain. Have trouble peeing. Notice that your lower belly feels firm. Have blood in your pee for more than 2 weeks after the procedure. Have blood in your semen for more than 2 months after the procedure. Have problems getting an erection. Feel sick to your stomach (nauseous) or throw up (vomit). Have new or worse  bleeding in your pee, poop, or semen. Get help right away if you: Have a fever or chills. Have bright red pee. Have very bad pain that does not get better with medicine. Cannot pee. Summary After this procedure, it is common to have pain and discomfort around your butt, especially while sitting. You may have blood in your pee and poop. It is common to have blood in your semen for 1-2 months. If you were prescribed antibiotic medicine, take it as told by your doctor. Do not stop taking the antibiotic even if you start to feel better. Get help right away if you have a fever or chills. This information is not intended to replace advice given to you by your health care provider. Make sure you discuss any questions you have with your healthcare provider. Document Revised: 08/11/2020 Document Reviewed: 06/12/2020 Elsevier Patient Education  2022 Elsevier Inc.  

## 2021-05-25 NOTE — Progress Notes (Signed)
Prostate Biopsy Procedure   Informed consent was obtained after discussing risks/benefits of the procedure.  A time out was performed to ensure correct patient identity.  Pre-Procedure: - Last PSA Level: 5.8 on 05/04/2021; 2.7 in 2019, 4.0 in 2021 and 5.2 on 02/2021 - Gentamicin given prophylactically - Levaquin 500 mg administered PO -Transrectal Ultrasound performed revealing a 50 gm prostate -No significant hypoechoic or median lobe noted  Procedure: - Prostate block performed using 10 cc 1% lidocaine and biopsies taken from sextant areas, a total of 12 under ultrasound guidance.  Post-Procedure: - Patient tolerated the procedure well - He was counseled to seek immediate medical attention if experiences any severe pain, significant bleeding, or fevers - Return in one week to discuss biopsy results    Irineo Axon, MD

## 2021-05-28 ENCOUNTER — Ambulatory Visit: Payer: Self-pay | Admitting: Urology

## 2021-05-28 ENCOUNTER — Telehealth: Payer: Self-pay | Admitting: *Deleted

## 2021-05-28 LAB — SURGICAL PATHOLOGY

## 2021-05-28 NOTE — Telephone Encounter (Signed)
Notified patient as instructed, patient pleased. Discussed follow-up appointments, patient agrees  

## 2021-05-28 NOTE — Telephone Encounter (Signed)
-----   Message from Riki Altes, MD sent at 05/28/2021  4:18 PM EDT ----- Prostate biopsy showed no evidence of cancer.  Can cancel follow-up appointment and reschedule for a 35-month follow-up with PSA prior.

## 2021-11-02 DIAGNOSIS — Z1389 Encounter for screening for other disorder: Secondary | ICD-10-CM | POA: Diagnosis not present

## 2021-11-02 DIAGNOSIS — I10 Essential (primary) hypertension: Secondary | ICD-10-CM | POA: Diagnosis not present

## 2021-11-02 DIAGNOSIS — Z23 Encounter for immunization: Secondary | ICD-10-CM | POA: Diagnosis not present

## 2021-11-02 DIAGNOSIS — R21 Rash and other nonspecific skin eruption: Secondary | ICD-10-CM | POA: Diagnosis not present

## 2021-11-02 DIAGNOSIS — N401 Enlarged prostate with lower urinary tract symptoms: Secondary | ICD-10-CM | POA: Diagnosis not present

## 2021-11-02 DIAGNOSIS — R972 Elevated prostate specific antigen [PSA]: Secondary | ICD-10-CM | POA: Diagnosis not present

## 2021-11-02 DIAGNOSIS — Z0130 Encounter for examination of blood pressure without abnormal findings: Secondary | ICD-10-CM | POA: Diagnosis not present

## 2021-11-17 DIAGNOSIS — Z113 Encounter for screening for infections with a predominantly sexual mode of transmission: Secondary | ICD-10-CM | POA: Diagnosis not present

## 2021-11-17 DIAGNOSIS — Z1159 Encounter for screening for other viral diseases: Secondary | ICD-10-CM | POA: Diagnosis not present

## 2021-11-30 ENCOUNTER — Other Ambulatory Visit: Payer: Self-pay

## 2021-11-30 ENCOUNTER — Encounter: Payer: Self-pay | Admitting: Urology

## 2021-11-30 ENCOUNTER — Other Ambulatory Visit: Payer: Medicare HMO

## 2021-11-30 DIAGNOSIS — R972 Elevated prostate specific antigen [PSA]: Secondary | ICD-10-CM

## 2021-12-03 ENCOUNTER — Ambulatory Visit: Payer: Medicare HMO | Admitting: Urology

## 2021-12-03 ENCOUNTER — Encounter: Payer: Self-pay | Admitting: Urology

## 2022-05-25 DIAGNOSIS — H9312 Tinnitus, left ear: Secondary | ICD-10-CM | POA: Diagnosis not present

## 2022-05-25 DIAGNOSIS — H9042 Sensorineural hearing loss, unilateral, left ear, with unrestricted hearing on the contralateral side: Secondary | ICD-10-CM | POA: Diagnosis not present

## 2023-08-05 DIAGNOSIS — L089 Local infection of the skin and subcutaneous tissue, unspecified: Secondary | ICD-10-CM | POA: Diagnosis not present

## 2023-08-05 DIAGNOSIS — I10 Essential (primary) hypertension: Secondary | ICD-10-CM | POA: Diagnosis not present

## 2023-08-05 DIAGNOSIS — M79672 Pain in left foot: Secondary | ICD-10-CM | POA: Diagnosis not present

## 2023-09-01 DIAGNOSIS — I10 Essential (primary) hypertension: Secondary | ICD-10-CM | POA: Diagnosis not present

## 2023-09-01 DIAGNOSIS — Z0131 Encounter for examination of blood pressure with abnormal findings: Secondary | ICD-10-CM | POA: Diagnosis not present

## 2023-09-01 DIAGNOSIS — Z23 Encounter for immunization: Secondary | ICD-10-CM | POA: Diagnosis not present

## 2023-09-01 DIAGNOSIS — Z1389 Encounter for screening for other disorder: Secondary | ICD-10-CM | POA: Diagnosis not present

## 2023-09-01 DIAGNOSIS — Z125 Encounter for screening for malignant neoplasm of prostate: Secondary | ICD-10-CM | POA: Diagnosis not present

## 2023-09-01 DIAGNOSIS — N401 Enlarged prostate with lower urinary tract symptoms: Secondary | ICD-10-CM | POA: Diagnosis not present

## 2023-09-01 DIAGNOSIS — Z1331 Encounter for screening for depression: Secondary | ICD-10-CM | POA: Diagnosis not present

## 2023-09-01 DIAGNOSIS — Z0000 Encounter for general adult medical examination without abnormal findings: Secondary | ICD-10-CM | POA: Diagnosis not present

## 2023-09-01 DIAGNOSIS — E785 Hyperlipidemia, unspecified: Secondary | ICD-10-CM | POA: Diagnosis not present

## 2023-09-01 DIAGNOSIS — Z131 Encounter for screening for diabetes mellitus: Secondary | ICD-10-CM | POA: Diagnosis not present

## 2023-09-01 DIAGNOSIS — R21 Rash and other nonspecific skin eruption: Secondary | ICD-10-CM | POA: Diagnosis not present

## 2023-09-01 DIAGNOSIS — R972 Elevated prostate specific antigen [PSA]: Secondary | ICD-10-CM | POA: Diagnosis not present

## 2023-09-01 DIAGNOSIS — Z113 Encounter for screening for infections with a predominantly sexual mode of transmission: Secondary | ICD-10-CM | POA: Diagnosis not present

## 2023-09-01 DIAGNOSIS — Z1159 Encounter for screening for other viral diseases: Secondary | ICD-10-CM | POA: Diagnosis not present

## 2023-09-01 DIAGNOSIS — F5221 Male erectile disorder: Secondary | ICD-10-CM | POA: Diagnosis not present

## 2023-09-12 DIAGNOSIS — Z1389 Encounter for screening for other disorder: Secondary | ICD-10-CM | POA: Diagnosis not present

## 2023-09-12 DIAGNOSIS — K921 Melena: Secondary | ICD-10-CM | POA: Diagnosis not present

## 2023-09-12 DIAGNOSIS — Z0131 Encounter for examination of blood pressure with abnormal findings: Secondary | ICD-10-CM | POA: Diagnosis not present

## 2023-09-12 DIAGNOSIS — L989 Disorder of the skin and subcutaneous tissue, unspecified: Secondary | ICD-10-CM | POA: Diagnosis not present

## 2023-09-19 NOTE — Progress Notes (Unsigned)
09/21/2023 9:28 AM   Don Morgan [birth year] [id number]  Referring provider: No referring provider defined for this encounter.  Urological history: 1. Elevated PSA  -PSA pending  -prostate bx (05/2021) - iPSA 5.5 - negative  2. BPH with LU TS -prostate volume (05/2021) 50 grams   3. Erectile dysfunction -Contributing factors of age, BPH, hyperlipidemia, hypertension and sleep apnea -Sildenafil 100 mg on demand dosing  4.  Nephrolithiasis -URS (2014) -ESWL (2014)   5.  Urinary retention -Secondary to anesthesia (2018)   Chief Complaint  Patient presents with   Elevated PSA   HPI: Don Morgan is a 62 y.o. male who presents today for follow up for elevated PSA.   Previous records reviewed.  His PSA at [facility]  has been 4.0 and 2021, 5.2 in 2022 and 10.4 in 2024.  KUB stable left lower pole stone  I PSS 10/2  He is experiencing some postvoid dribbling, occasional weak urinary stream, occasional frequency and occasional urgency.  Patient denies any modifying or aggravating factors.  Patient denies any recent UTI's, gross hematuria, dysuria or suprapubic/flank pain.  Patient denies any fevers, chills, nausea or vomiting.     IPSS     Row Name 09/21/23 0800         International Prostate Symptom Score   How often have you had the sensation of not emptying your bladder? Less than 1 in 5     How often have you had to urinate less than every two hours? Less than half the time     How often have you found you stopped and started again several times when you urinated? About half the time     How often have you found it difficult to postpone urination? Less than 1 in 5 times     How often have you had a weak urinary stream? Less than 1 in 5 times     How often have you had to strain to start urination? Not at All     How many times did you typically get up at night to urinate? 2 Times     Total IPSS Score 10       Quality of Life due to urinary symptoms    If you were to spend the rest of your life with your urinary condition just the way it is now how would you feel about that? Mostly Satisfied              Score:  1-7 Mild 8-19 Moderate 20-35 Severe    SHIM 20  He has had he is still getting occasional spontaneous erection.  He does not have pain with erections.  He he does not have curvature with erections.   SHIM     Row Name 09/21/23 0857         SHIM: Over the last 6 months:   How do you rate your confidence that you could get and keep an erection? Moderate     When you had erections with sexual stimulation, how often were your erections hard enough for penetration (entering your partner)? Most Times (much more than half the time)     During sexual intercourse, how often were you able to maintain your erection after you had penetrated (entered) your partner? Most Times (much more than half the time)     During sexual intercourse, how difficult was it to maintain your erection to completion of intercourse? Not Difficult     When  you attempted sexual intercourse, how often was it satisfactory for you? Most Times (much more than half the time)       SHIM Total Score   SHIM 20              Score: 1-7 Severe ED 8-11 Moderate ED 12-16 Mild-Moderate ED 17-21 Mild ED 22-25 No ED   PMH: Past Medical History:  Diagnosis Date   GERD (gastroesophageal reflux disease)    History of kidney stones    Hyperlipidemia    Hypertension    Internal hemorrhoid, bleeding    OSA (obstructive sleep apnea)    per pt has not gone back to get fiited for cpap yet---  was recommeded cpap and wt loss    Surgical History: Past Surgical History:  Procedure Laterality Date   ANTERIOR CERVICAL DISCECTOMY  01/08/2009   C6 -- C7  TOTAL DISK REPLACEMENT   CARDIOVASCULAR STRESS TEST  11-07-2015   dr Juliann Pares [facility])   normal perfusion nuclear study w/ no ischemia/  normal LV function and wall motion, ef 57%    CYSTOSCOPY/RETROGRADE/URETEROSCOPY/STONE EXTRACTION WITH BASKET  2014   EXTRACORPOREAL SHOCK WAVE LITHOTRIPSY  2014   TRANSANAL HEMORRHOIDAL DEARTERIALIZATION N/A 01/26/2017   Procedure: TRANSANAL HEMORRHOIDAL DEARTERIALIZATION;  Surgeon: Romie Levee, MD;  Location: [facility];  Service: General;  Laterality: N/A;   TRANSTHORACIC ECHOCARDIOGRAM  11-07-2015   dr Juliann Pares   mild LVH,  ef 55%/  mild MR and TR/  trivial PR    Home Medications:  Allergies as of 09/21/2023       Reactions   Other    Other reaction(s): Unknown   Morphine And Codeine Rash        Medication List        Accurate as of September 21, 2023  9:28 AM. If you have any questions, ask your nurse or doctor.          aspirin EC 81 MG tablet Take 81 mg by mouth daily.   atorvastatin 40 MG tablet Commonly known as: LIPITOR Take 40 mg by mouth daily as needed.   calcium carbonate 500 MG chewable tablet Commonly known as: TUMS - dosed in mg elemental calcium Chew 1 tablet by mouth as needed for indigestion or heartburn.   lisinopril-hydrochlorothiazide 20-12.5 MG tablet Commonly known as: ZESTORETIC Take 1 tablet by mouth 2 (two) times daily.   omeprazole 20 MG capsule Commonly known as: PRILOSEC Take 20 mg by mouth as needed.   sildenafil 100 MG tablet Commonly known as: VIAGRA Take by mouth.   triamcinolone ointment 0.5 % Commonly known as: KENALOG SMARTSIG:1 Topical Daily PRN        Allergies:  Allergies  Allergen Reactions   Other     Other reaction(s): Unknown   Morphine And Codeine Rash    Family History: Family History  Problem Relation Age of Onset   Prostate cancer Father    Hematuria Father     Social History:  reports that he has never smoked. He has never used smokeless tobacco. He reports that he does not drink alcohol and does not use drugs.  ROS: Pertinent ROS in HPI  Physical Exam: BP (!) 155/95   Pulse 78   Ht 6' (1.829 m)   Wt 238 lb (108  kg)   BMI 32.28 kg/m   Constitutional:  Well nourished. Alert and oriented, No acute distress. HEENT: [place] AT, moist mucus membranes.  Trachea midline Cardiovascular: No clubbing, cyanosis, or edema.  Respiratory: Normal respiratory effort, no increased work of breathing. GU: No CVA tenderness.  No bladder fullness or masses.  Patient with uncircumcised phallus. Foreskin easily retracted  Urethral meatus is patent.  No penile discharge. No penile lesions or rashes. Scrotum without lesions, cysts, rashes and/or edema.  Testicles are located scrotally bilaterally. No masses are appreciated in the testicles. Left and right epididymis are normal. Rectal: Patient with  normal sphincter tone. Anus and perineum without scarring or rashes. No rectal masses are appreciated. Prostate is approximately 60 + grams, could not palpate the entire gland, no nodules are appreciated. Seminal vesicles could not be palpated.  Neurologic: Grossly intact, no focal deficits, moving all 4 extremities. Psychiatric: Normal mood and affect.  Laboratory Data: Labs pending I have reviewed the labs.   Pertinent Imaging: KUB left lower pole stone, radiologist station still pending  I have independently reviewed the films.    Assessment & Plan:    1.  Elevated PSA -Free and total PSA pending   2. BPH with LUTS -PSA pending  -DRE -could not palpate the entire gland -most bothersome symptoms are post void dribbling  -continue conservative management, avoiding bladder irritants and timed voiding's  3. Erectile dysfunction - continue Viagra   4.  Nephrolithiasis -KUB stable left lower pole stone   Return for pending free and total PSA results .  These notes generated with voice recognition software. I apologize for typographical errors.  Cloretta Ned  [facility] [address]  [address] [place], [place] [address] [phone]

## 2023-09-21 ENCOUNTER — Other Ambulatory Visit: Payer: Self-pay | Admitting: *Deleted

## 2023-09-21 ENCOUNTER — Ambulatory Visit
Admission: RE | Admit: 2023-09-21 | Discharge: 2023-09-21 | Disposition: A | Discharge status: Home / Self Care | Payer: Medicare HMO | Source: Ambulatory Visit | Attending: Urology | Admitting: Urology

## 2023-09-21 ENCOUNTER — Ambulatory Visit
Admission: RE | Admit: 2023-09-21 | Discharge: 2023-09-21 | Disposition: A | Discharge status: Home / Self Care | Payer: Medicare HMO | Attending: Urology | Admitting: Urology

## 2023-09-21 ENCOUNTER — Encounter: Payer: Self-pay | Admitting: Urology

## 2023-09-21 ENCOUNTER — Ambulatory Visit: Payer: Medicare HMO | Admitting: Urology

## 2023-09-21 VITALS — BP 155/95 | HR 78 | Ht 72.0 in | Wt 238.0 lb

## 2023-09-21 DIAGNOSIS — N138 Other obstructive and reflux uropathy: Secondary | ICD-10-CM

## 2023-09-21 DIAGNOSIS — N529 Male erectile dysfunction, unspecified: Secondary | ICD-10-CM

## 2023-09-21 DIAGNOSIS — N401 Enlarged prostate with lower urinary tract symptoms: Secondary | ICD-10-CM | POA: Diagnosis not present

## 2023-09-21 DIAGNOSIS — I878 Other specified disorders of veins: Secondary | ICD-10-CM | POA: Diagnosis not present

## 2023-09-21 DIAGNOSIS — R972 Elevated prostate specific antigen [PSA]: Secondary | ICD-10-CM

## 2023-09-21 DIAGNOSIS — N200 Calculus of kidney: Secondary | ICD-10-CM | POA: Diagnosis not present

## 2023-09-21 DIAGNOSIS — N2889 Other specified disorders of kidney and ureter: Secondary | ICD-10-CM | POA: Diagnosis not present

## 2023-09-22 ENCOUNTER — Other Ambulatory Visit: Payer: Self-pay | Admitting: Urology

## 2023-09-22 DIAGNOSIS — R972 Elevated prostate specific antigen [PSA]: Secondary | ICD-10-CM

## 2023-09-22 DIAGNOSIS — N138 Other obstructive and reflux uropathy: Secondary | ICD-10-CM

## 2023-09-22 LAB — PSA TOTAL (REFLEX TO FREE): Prostate Specific Ag, Serum: 12.1 ng/mL — ABNORMAL HIGH (ref 0.0–4.0)

## 2023-10-10 ENCOUNTER — Ambulatory Visit
Admission: RE | Admit: 2023-10-10 | Discharge: 2023-10-10 | Disposition: A | Discharge status: Home / Self Care | Payer: Medicare HMO | Source: Ambulatory Visit | Attending: Urology | Admitting: Urology

## 2023-10-10 DIAGNOSIS — N401 Enlarged prostate with lower urinary tract symptoms: Secondary | ICD-10-CM | POA: Insufficient documentation

## 2023-10-10 DIAGNOSIS — N400 Enlarged prostate without lower urinary tract symptoms: Secondary | ICD-10-CM | POA: Diagnosis not present

## 2023-10-10 DIAGNOSIS — N138 Other obstructive and reflux uropathy: Secondary | ICD-10-CM | POA: Insufficient documentation

## 2023-10-10 DIAGNOSIS — N4289 Other specified disorders of prostate: Secondary | ICD-10-CM | POA: Diagnosis not present

## 2023-10-10 DIAGNOSIS — R972 Elevated prostate specific antigen [PSA]: Secondary | ICD-10-CM | POA: Diagnosis not present

## 2023-10-10 MED ORDER — GADOBUTROL 1 MMOL/ML IV SOLN
10.0000 mL | Freq: Once | INTRAVENOUS | Status: AC | PRN
  Administered 2023-10-10: 10 mL via INTRAVENOUS

## 2023-10-13 NOTE — Progress Notes (Signed)
 Ellouise Console, PA-C [address]  [address]  [place], [place] [address]  Main: [phone]  Fax: [phone]   Gastroenterology Consultation  Referring Provider:     Christianne Evangelist, MD Primary Care Physician:  Patient, No Pcp Per Primary Gastroenterologist:  Ellouise Console, PA-C / Dr. Rogelia Copping   Reason for Consultation:     Rectal bleeding, colonoscopy        HPI:   Don Morgan is a 63 y.o. y/o male referred for consultation & management  by Patient, No Pcp Per.    Patient states he has been seeing bright red blood on the tissue and in the toilet intermittently for the past year.  He states he had hemorrhoid surgery in 2018 in [place].  Has remote history of bleeding hemorrhoids.  He reports having 1 colonoscopy done in [place] around 2018, results are not available.  He thinks he may have had polyps.  He has had irregular bowel habits.  Typically has bowel movement every day with soft and hard he denies diarrhea, weight loss, or family history of colon cancer.  He denies any upper GI symptoms such as heartburn or dysphagia.  No recent labs.  Last CBC was in 2018 (hemoglobin 13.4).  Currently seeing urologist for elevated PSA, BPH, ED, nephrolithiasis.  Past Medical History:  Diagnosis Date  . GERD (gastroesophageal reflux disease)   . History of kidney stones   . Hyperlipidemia   . Hypertension   . Internal hemorrhoid, bleeding   . OSA (obstructive sleep apnea)    per pt has not gone back to get fiited for cpap yet---  was recommeded cpap and wt loss    Past Surgical History:  Procedure Laterality Date  . ANTERIOR CERVICAL DISCECTOMY  01/08/2009   C6 -- C7  TOTAL DISK REPLACEMENT  . CARDIOVASCULAR STRESS TEST  11-07-2015   dr florencio [facility] clinic)   normal perfusion nuclear study w/ no ischemia/  normal LV function and wall motion, ef 57%  . CYSTOSCOPY/RETROGRADE/URETEROSCOPY/STONE EXTRACTION WITH BASKET  2014  . EXTRACORPOREAL SHOCK WAVE LITHOTRIPSY   2014  . TRANSANAL HEMORRHOIDAL DEARTERIALIZATION N/A 01/26/2017   Procedure: TRANSANAL HEMORRHOIDAL DEARTERIALIZATION;  Surgeon: Bernarda Ned, MD;  Location: [facility];  Service: General;  Laterality: N/A;  . TRANSTHORACIC ECHOCARDIOGRAM  11-07-2015   dr florencio   mild LVH,  ef 55%/  mild MR and TR/  trivial PR    Prior to Admission medications   Medication Sig Start Date End Date Taking? Authorizing Provider  aspirin  EC 81 MG tablet Take 81 mg by mouth daily.    [provider]  atorvastatin  (LIPITOR) 40 MG tablet Take 40 mg by mouth daily as needed. 04/27/21   [provider]  calcium  carbonate (TUMS - DOSED IN MG ELEMENTAL CALCIUM ) 500 MG chewable tablet Chew 1 tablet by mouth as needed for indigestion or heartburn.    [provider]  lisinopril-hydrochlorothiazide (PRINZIDE,ZESTORETIC) 20-12.5 MG tablet Take 1 tablet by mouth 2 (two) times daily.    [provider]  omeprazole (PRILOSEC) 20 MG capsule Take 20 mg by mouth as needed.    [provider]  sildenafil (VIAGRA) 100 MG tablet Take by mouth. 04/27/21   [provider]  triamcinolone ointment (KENALOG) 0.5 % SMARTSIG:1 Topical Daily PRN 04/27/21   [provider]    Family History  Problem Relation Age of Onset  . Prostate cancer Father   . Hematuria Father  Social History   Tobacco Use  . Smoking status: Never  . Smokeless tobacco: Never  Substance Use Topics  . Alcohol use: No  . Drug use: No    Allergies as of 10/14/2023 - Review Complete 10/14/2023  Allergen Reaction Noted  . Other  10/31/2015  . Morphine and codeine Rash 01/11/2016    Review of Systems:    All systems reviewed and negative except where noted in HPI.   Physical Exam:  BP (!) 160/81   Pulse 77   Temp 98.7 F (37.1 C)   Ht 6' (1.829 m)   Wt 247 lb (112 kg)   BMI 33.50 kg/m  No LMP for male patient.  General:   Alert,  Well-developed, well-nourished,  pleasant and cooperative in NAD Lungs:  Respirations even and unlabored.  Clear throughout to auscultation.   No wheezes, crackles, or rhonchi. No acute distress. Heart:  Regular rate and rhythm; no murmurs, clicks, rubs, or gallops. Abdomen:  Normal bowel sounds.  No bruits.  Soft, and non-distended without masses, hepatosplenomegaly or hernias noted.  No Tenderness.  No guarding or rebound tenderness.    Rectal:  Pt. Deferred until time of colonoscopy. Neurologic:  Alert and oriented x3;  grossly normal neurologically. Psych:  Alert and cooperative. Normal mood and affect.  Imaging Studies: MR PROSTATE W WO CONTRAST Result Date: 10/13/2023 CLINICAL DATA:  Elevated PSA levels.  R97.20 EXAM: MR PROSTATE WITHOUT AND WITH CONTRAST TECHNIQUE: Multiplanar multisequence MRI images were obtained of the pelvis centered about the prostate. Pre and post contrast images were obtained. CONTRAST:  10mL GADAVIST  GADOBUTROL  1 MMOL/ML IV SOLN COMPARISON:  None Available. FINDINGS: Prostate: Hazy low T2 signal in the peripheral zone is nonfocal, likely postinflammatory, and is considered PI-RADS category 2. Mild benign prostatic hypertrophy. Region of interest # 1: PI-RADS category 4 lesion of the right anterior and posterior transition zone in the mid gland with focally reduced T2 signal which is relatively sharply defined (image 42 of series 9) corresponding to focally reduced ADC map activity and restricted diffusion (image 12 of series 7 and 8). This measures 0.76 cc (1.3 by 1.0 by 0.9 cm). Volume: 3D volumetric analysis: Prostate volume 51.5 cc (5.0 by 4.4 by 5.0 cm). Transcapsular spread: Absent Seminal vesicle involvement: Absent Neurovascular bundle involvement: Absent Pelvic adenopathy: Absent Bone metastasis: Absent Other findings: Probable small focus of remote fat necrosis just cephalad to the urinary bladder measures 0.8 cm in short axis on image 2 series 5. IMPRESSION: 1. PI-RADS category 4 lesion of the  right transition zone in the mid gland. Targeting data sent to [facility]. 2. Mild benign prostatic hypertrophy. Electronically Signed   By: Ryan Salvage M.D.   On: 10/13/2023 12:40   Abdomen 1 view (KUB) Result Date: 10/02/2023 CLINICAL DATA:  Kidney stone. EXAM: ABDOMEN - 1 VIEW COMPARISON:  Radiograph 05/04/2021 FINDINGS: 12 mm calcification projecting over the lower pole the left kidney, previously 9 mm. Punctate stone projecting over the lower pole of the right kidney. No evidence of ureteral calculus. Stable pelvic phleboliths. Normal bowel gas pattern. Small volume of stool in the colon primarily right colon. Included lung bases are clear. IMPRESSION: 1. A 12 mm calcification projects over the lower pole of the left kidney, previously 9 mm. 2. Punctate stone projecting over the lower pole of the right kidney. Electronically Signed   By: Andrea Gasman M.D.   On: 10/02/2023 06:42    Assessment and Plan:   Don Morgan is a  62 y.o. y/o male has been referred for   1.  Rectal bleeding  Lab: CBC  Scheduling Colonoscopy I discussed risks of colonoscopy with patient to include risk of bleeding, colon perforation, and risk of sedation.  Patient expressed understanding and agrees to proceed with colonoscopy.   2.  Hemorrhoids  For External Hemorrhoids: Warm water sitz bath with epsom salt for flare up of external hemorrhoids. Use OTC Preparation H, Tucks Pads, and Witch Hazel wipes as needed.  For Internal Hemorrhoids: Rx Hydrocortisone  Suppositories 25mg  Insert 1 into rectum once daily at bedtime for 12 days. Discussed Internal Hemorrhoid Banding if no improvement with conservative treament.   3.  Irregular bowel habits Start OTC Benefiber Powder. Mix 1 - 2 Tablespoons in 6 - 8 ounces of a Drink Once Daily. Drink 64 ounces of water / fluids Daily.   Follow up 4 weeks after colonoscopy with TG.  Ellouise Console, PA-C

## 2023-10-14 ENCOUNTER — Ambulatory Visit (INDEPENDENT_AMBULATORY_CARE_PROVIDER_SITE_OTHER): Payer: Medicare PPO | Admitting: Physician Assistant

## 2023-10-14 ENCOUNTER — Encounter: Payer: Self-pay | Admitting: Physician Assistant

## 2023-10-14 VITALS — BP 160/81 | HR 77 | Temp 98.7°F | Ht 72.0 in | Wt 247.0 lb

## 2023-10-14 DIAGNOSIS — R198 Other specified symptoms and signs involving the digestive system and abdomen: Secondary | ICD-10-CM

## 2023-10-14 DIAGNOSIS — K644 Residual hemorrhoidal skin tags: Secondary | ICD-10-CM

## 2023-10-14 DIAGNOSIS — K648 Other hemorrhoids: Secondary | ICD-10-CM

## 2023-10-14 DIAGNOSIS — K625 Hemorrhage of anus and rectum: Secondary | ICD-10-CM

## 2023-10-14 DIAGNOSIS — R194 Change in bowel habit: Secondary | ICD-10-CM

## 2023-10-14 DIAGNOSIS — K649 Unspecified hemorrhoids: Secondary | ICD-10-CM

## 2023-10-14 MED ORDER — PEG 3350-KCL-NA BICARB-NACL 420 G PO SOLR: 4000.0000 mL | Freq: Once | ORAL | 0 refills | Status: AC

## 2023-10-14 MED ORDER — HYDROCORTISONE ACETATE 25 MG RE SUPP: 25.0000 mg | Freq: Every day | RECTAL | 1 refills | Status: DC

## 2023-10-14 NOTE — Patient Instructions (Addendum)
 For External Hemorrhoids: Warm water sitz bath with epsom salt for flare up of external hemorrhoids. Use OTC Preparation H, Tucks Pads, and Witch Hazel wipes as needed.  For Internal Hemorrhoids: Rx Hydrocortisone  Suppositories 25mg  Insert 1 into rectum once daily at bedtime for 12 days. Discussed Internal Hemorrhoid Banding if no improvement with conservative treament.   For Irregular bowel habits: Start OTC Benefiber Powder. Mix 1 - 2 Tablespoons in 6 - 8 ounces of a Drink Once Daily. Drink 64 ounces of water / fluids Daily.

## 2023-10-15 LAB — CBC WITH DIFFERENTIAL/PLATELET
Basophils Absolute: 0.1 10*3/uL (ref 0.0–0.2)
Basos: 1 %
EOS (ABSOLUTE): 0.1 10*3/uL (ref 0.0–0.4)
Eos: 2 %
Hematocrit: 41 % (ref 37.5–51.0)
Hemoglobin: 13.8 g/dL (ref 13.0–17.7)
Immature Grans (Abs): 0 10*3/uL (ref 0.0–0.1)
Immature Granulocytes: 0 %
Lymphocytes Absolute: 1.4 10*3/uL (ref 0.7–3.1)
Lymphs: 26 %
MCH: 28.8 pg (ref 26.6–33.0)
MCHC: 33.7 g/dL (ref 31.5–35.7)
MCV: 86 fL (ref 79–97)
Monocytes Absolute: 0.7 10*3/uL (ref 0.1–0.9)
Monocytes: 12 %
Neutrophils Absolute: 3.3 10*3/uL (ref 1.4–7.0)
Neutrophils: 59 %
Platelets: 438 10*3/uL (ref 150–450)
RBC: 4.79 x10E6/uL (ref 4.14–5.80)
RDW: 13 % (ref 11.6–15.4)
WBC: 5.6 10*3/uL (ref 3.4–10.8)

## 2023-10-16 NOTE — Progress Notes (Signed)
 Call and notify patient his blood test shows normal hemoglobin 13.8.  No anemia.  Normal white count.  No evidence of infection.  Continue with plan for colonoscopy as scheduled. Celso Amy, PA-C

## 2023-10-17 ENCOUNTER — Other Ambulatory Visit: Payer: Self-pay | Admitting: Urology

## 2023-10-17 ENCOUNTER — Telehealth: Payer: Self-pay

## 2023-10-17 DIAGNOSIS — R972 Elevated prostate specific antigen [PSA]: Secondary | ICD-10-CM

## 2023-10-17 NOTE — Telephone Encounter (Signed)
 Patient notified.   [place], PA-C  Trudy Leeanna RAMAN, [place] Call and notify patient his blood test shows normal hemoglobin 13.8.  No anemia.  Normal white count.  No evidence of infection.  Continue with plan for colonoscopy as scheduled. [place], PA-C

## 2023-10-21 ENCOUNTER — Telehealth: Payer: Self-pay

## 2023-10-21 NOTE — Telephone Encounter (Signed)
 Pt left message on triage line asking about his appt for fusion bx.   Per SM note [facility] due to pt wanting nitrous. Records faxed to [facility] on 1/6 per CC.   Pt aware records have been faxed and [facility] will be in touch. Offered to give pts the number but he is driving. Pt will look up the number and give them a call.   If unable to schedule with [facility] per SM we can schedule fusion in [place] with [facility] .   Pt voiced understanding.

## 2023-11-04 ENCOUNTER — Ambulatory Visit
Admission: RE | Admit: 2023-11-04 | Discharge: 2023-11-04 | Disposition: A | Discharge status: Home / Self Care | Payer: Medicare PPO | Attending: Gastroenterology | Admitting: Gastroenterology

## 2023-11-04 ENCOUNTER — Encounter
Admission: RE | Disposition: A | Discharge status: Home / Self Care | Payer: Self-pay | Source: Home / Self Care | Attending: Gastroenterology

## 2023-11-04 ENCOUNTER — Ambulatory Visit: Payer: Medicare PPO | Admitting: Anesthesiology

## 2023-11-04 ENCOUNTER — Encounter: Payer: Self-pay | Admitting: Gastroenterology

## 2023-11-04 DIAGNOSIS — K921 Melena: Secondary | ICD-10-CM | POA: Insufficient documentation

## 2023-11-04 DIAGNOSIS — D123 Benign neoplasm of transverse colon: Secondary | ICD-10-CM | POA: Diagnosis not present

## 2023-11-04 DIAGNOSIS — I10 Essential (primary) hypertension: Secondary | ICD-10-CM | POA: Insufficient documentation

## 2023-11-04 DIAGNOSIS — K219 Gastro-esophageal reflux disease without esophagitis: Secondary | ICD-10-CM | POA: Insufficient documentation

## 2023-11-04 DIAGNOSIS — E66813 Obesity, class 3: Secondary | ICD-10-CM | POA: Diagnosis not present

## 2023-11-04 DIAGNOSIS — Z79899 Other long term (current) drug therapy: Secondary | ICD-10-CM | POA: Diagnosis not present

## 2023-11-04 DIAGNOSIS — Z6833 Body mass index (BMI) 33.0-33.9, adult: Secondary | ICD-10-CM | POA: Diagnosis not present

## 2023-11-04 DIAGNOSIS — K644 Residual hemorrhoidal skin tags: Secondary | ICD-10-CM | POA: Diagnosis not present

## 2023-11-04 DIAGNOSIS — K625 Hemorrhage of anus and rectum: Secondary | ICD-10-CM

## 2023-11-04 DIAGNOSIS — G4733 Obstructive sleep apnea (adult) (pediatric): Secondary | ICD-10-CM | POA: Insufficient documentation

## 2023-11-04 DIAGNOSIS — K641 Second degree hemorrhoids: Secondary | ICD-10-CM | POA: Diagnosis not present

## 2023-11-04 DIAGNOSIS — K635 Polyp of colon: Secondary | ICD-10-CM

## 2023-11-04 HISTORY — PX: POLYPECTOMY: SHX5525

## 2023-11-04 HISTORY — PX: COLONOSCOPY WITH PROPOFOL: SHX5780

## 2023-11-04 SURGERY — COLONOSCOPY WITH PROPOFOL
Anesthesia: General

## 2023-11-04 MED ORDER — PROPOFOL 10 MG/ML IV BOLUS
INTRAVENOUS | Status: DC | PRN
  Administered 2023-11-04: 100 ug/kg/min via INTRAVENOUS
  Administered 2023-11-04: 50 mg via INTRAVENOUS
  Administered 2023-11-04: 100 mg via INTRAVENOUS

## 2023-11-04 MED ORDER — SODIUM CHLORIDE 0.9 % IV SOLN: INTRAVENOUS | Status: DC

## 2023-11-04 MED ORDER — LIDOCAINE HCL (CARDIAC) PF 100 MG/5ML IV SOSY
PREFILLED_SYRINGE | INTRAVENOUS | Status: DC | PRN
  Administered 2023-11-04: 50 mg via INTRAVENOUS

## 2023-11-04 MED ORDER — LIDOCAINE HCL (PF) 2 % IJ SOLN
INTRAMUSCULAR | Status: AC
  Filled 2023-11-04: qty 5

## 2023-11-04 NOTE — Anesthesia Preprocedure Evaluation (Signed)
Anesthesia Evaluation  Patient identified by MRN, date of birth, ID band Patient awake    Reviewed: Allergy & Precautions, NPO status , Patient's Chart, lab work & pertinent test results  Airway Mallampati: II  TM Distance: >3 FB Neck ROM: full    Dental  (+) Teeth Intact   Pulmonary neg pulmonary ROS, sleep apnea    Pulmonary exam normal breath sounds clear to auscultation       Cardiovascular Exercise Tolerance: Good hypertension, Pt. on medications negative cardio ROS Normal cardiovascular exam Rhythm:Regular Rate:Normal     Neuro/Psych negative neurological ROS  negative psych ROS   GI/Hepatic negative GI ROS, Neg liver ROS,GERD  Medicated,,  Endo/Other  negative endocrine ROS  Class 3 obesity  Renal/GU negative Renal ROS  negative genitourinary   Musculoskeletal   Abdominal  (+) + obese  Peds negative pediatric ROS (+)  Hematology negative hematology ROS (+)   Anesthesia Other Findings Past Medical History: No date: GERD (gastroesophageal reflux disease) No date: History of kidney stones No date: Hyperlipidemia No date: Hypertension No date: Internal hemorrhoid, bleeding No date: OSA (obstructive sleep apnea)     Comment:  per pt has not gone back to get fiited for cpap yet---                was recommeded cpap and wt loss  Past Surgical History: 01/08/2009: ANTERIOR CERVICAL DISCECTOMY     Comment:  C6 -- C7  TOTAL DISK REPLACEMENT 11-07-2015   dr Juliann Pares [facility] clinic): CARDIOVASCULAR STRESS TEST     Comment:  normal perfusion nuclear study w/ no ischemia/  normal               LV function and wall motion, ef 57% 2014: CYSTOSCOPY/RETROGRADE/URETEROSCOPY/STONE EXTRACTION WITH BASKET 2014: EXTRACORPOREAL SHOCK WAVE LITHOTRIPSY 01/26/2017: TRANSANAL HEMORRHOIDAL DEARTERIALIZATION; N/A     Comment:  Procedure: TRANSANAL HEMORRHOIDAL DEARTERIALIZATION;                Surgeon: Romie Levee, MD;   Location: [facility];  Service: General;  Laterality: N/A; 11-07-2015   dr Juliann Pares: TRANSTHORACIC ECHOCARDIOGRAM     Comment:  mild LVH,  ef 55%/  mild MR and TR/  trivial PR     Reproductive/Obstetrics negative OB ROS                             Anesthesia Physical Anesthesia Plan  ASA: 3  Anesthesia Plan: General   Post-op Pain Management:    Induction: Intravenous  PONV Risk Score and Plan: Propofol infusion and TIVA  Airway Management Planned: Natural Airway and Nasal Cannula  Additional Equipment:   Intra-op Plan:   Post-operative Plan:   Informed Consent: I have reviewed the patients History and Physical, chart, labs and discussed the procedure including the risks, benefits and alternatives for the proposed anesthesia with the patient or authorized representative who has indicated his/her understanding and acceptance.     Dental Advisory Given  Plan Discussed with: CRNA and Surgeon  Anesthesia Plan Comments:        Anesthesia Quick Evaluation

## 2023-11-04 NOTE — Anesthesia Procedure Notes (Signed)
Procedure Name: MAC Date/Time: 11/04/2023 8:39 AM  Performed by: Lily Lovings, CRNAPre-anesthesia Checklist: Patient identified, Emergency Drugs available, Suction available and Patient being monitored Patient Re-evaluated:Patient Re-evaluated prior to induction Oxygen Delivery Method: Simple face mask Preoxygenation: Pre-oxygenation with 100% oxygen Induction Type: IV induction Comments: POM

## 2023-11-04 NOTE — Transfer of Care (Signed)
Immediate Anesthesia Transfer of Care Note  Patient: Don Morgan  Procedure(s) Performed: COLONOSCOPY WITH PROPOFOL  Patient Location: Endoscopy Unit  Anesthesia Type:General  Level of Consciousness: drowsy and patient cooperative  Airway & Oxygen Therapy: Patient Spontanous Breathing  Post-op Assessment: Report given to RN  Post vital signs: Reviewed and stable  Last Vitals:  Vitals Value Taken Time  BP 158/88 11/04/23 0858  Temp    Pulse 87 11/04/23 0858  Resp 14 11/04/23 0858  SpO2 95 % 11/04/23 0858    Last Pain:  Vitals:   11/04/23 0858  TempSrc:   PainSc: 0-No pain         Complications: No notable events documented.

## 2023-11-04 NOTE — H&P (Signed)
Don Minium, MD [facility] [address] [place], [place] [address] Phone:[phone] Fax : [phone]  Primary Care Physician:  Pcp, No Primary Gastroenterologist:  Dr. Servando Snare  Pre-Procedure History & Physical: HPI:  Don Morgan is a 63 y.o. male is here for an colonoscopy.   Past Medical History:  Diagnosis Date   GERD (gastroesophageal reflux disease)    History of kidney stones    Hyperlipidemia    Hypertension    Internal hemorrhoid, bleeding    OSA (obstructive sleep apnea)    per pt has not gone back to get fiited for cpap yet---  was recommeded cpap and wt loss    Past Surgical History:  Procedure Laterality Date   ANTERIOR CERVICAL DISCECTOMY  01/08/2009   C6 -- C7  TOTAL DISK REPLACEMENT   CARDIOVASCULAR STRESS TEST  11-07-2015   dr Juliann Pares [facility] clinic)   normal perfusion nuclear study w/ no ischemia/  normal LV function and wall motion, ef 57%   CYSTOSCOPY/RETROGRADE/URETEROSCOPY/STONE EXTRACTION WITH BASKET  2014   EXTRACORPOREAL SHOCK WAVE LITHOTRIPSY  2014   TRANSANAL HEMORRHOIDAL DEARTERIALIZATION N/A 01/26/2017   Procedure: TRANSANAL HEMORRHOIDAL DEARTERIALIZATION;  Surgeon: Romie Levee, MD;  Location: [facility];  Service: General;  Laterality: N/A;   TRANSTHORACIC ECHOCARDIOGRAM  11-07-2015   dr Juliann Pares   mild LVH,  ef 55%/  mild MR and TR/  trivial PR    Prior to Admission medications   Medication Sig Start Date End Date Taking? Authorizing Provider  aspirin EC 81 MG tablet Take 81 mg by mouth daily.    [provider]  atorvastatin (LIPITOR) 40 MG tablet Take 40 mg by mouth daily as needed. 04/27/21   [provider]  calcium carbonate (TUMS - DOSED IN MG ELEMENTAL CALCIUM) 500 MG chewable tablet Chew 1 tablet by mouth as needed for indigestion or heartburn.    [provider]  hydrocortisone (ANUSOL-HC) 25 MG suppository Place 1 suppository (25 mg total) rectally at bedtime. 10/14/23   Celso Amy, PA-C  lisinopril-hydrochlorothiazide (PRINZIDE,ZESTORETIC) 20-12.5 MG tablet Take 1 tablet by mouth 2 (two) times daily.    [provider]  omeprazole (PRILOSEC OTC) 20 MG tablet Take 20 mg by mouth daily.    [provider]  sildenafil (VIAGRA) 100 MG tablet Take by mouth. 04/27/21   [provider]  triamcinolone ointment (KENALOG) 0.5 % SMARTSIG:1 Topical Daily PRN 04/27/21   [provider]    Allergies as of 10/14/2023 - Review Complete 10/14/2023  Allergen Reaction Noted   Other  10/31/2015   Morphine and codeine Rash 01/11/2016    Family History  Problem Relation Age of Onset   Prostate cancer Father    Hematuria Father     Social History   Socioeconomic History   Marital status: Divorced    Spouse name: Not on file   Number of children: Not on file   Years of education: Not on file   Highest education level: Not on file  Occupational History   Not on file  Tobacco Use   Smoking status: Never   Smokeless tobacco: Never  Substance and Sexual Activity   Alcohol use: No   Drug use: No   Sexual activity: Not on file  Other Topics Concern   Not on file  Social History Narrative   Not on file   Social Drivers of Health   Financial Resource Strain: Not on file  Food Insecurity: Not on file  Transportation Needs:  Not on file  Physical Activity: Not on file  Stress: Not on file  Social Connections: Not on file  Intimate Partner Violence: Not on file    Review of Systems: See HPI, otherwise negative ROS  Physical Exam: BP 138/88   Pulse 70   Temp (!) 96.4 F (35.8 C) (Temporal)   Resp 16   Ht 6' (1.829 m)   Wt 110.7 kg   SpO2 99%   BMI 33.09 kg/m  General:   Alert,  pleasant and cooperative in NAD Head:  Normocephalic and atraumatic. Neck:  Supple; no masses or thyromegaly. Lungs:  Clear throughout to auscultation.    Heart:  Regular rate and rhythm. Abdomen:  Soft, nontender and nondistended. Normal bowel  sounds, without guarding, and without rebound.   Neurologic:  Alert and  oriented x4;  grossly normal neurologically.  Impression/Plan: Don Morgan is here for an colonoscopy to be performed for rectal bleeding  Risks, benefits, limitations, and alternatives regarding  colonoscopy have been reviewed with the patient.  Questions have been answered.  All parties agreeable.   Don Minium, MD  11/04/2023, 8:34 AM

## 2023-11-04 NOTE — Anesthesia Postprocedure Evaluation (Signed)
Anesthesia Post Note  Patient: Don Morgan  Procedure(s) Performed: COLONOSCOPY WITH PROPOFOL  Patient location during evaluation: PACU Anesthesia Type: General Level of consciousness: awake Pain management: pain level controlled Vital Signs Assessment: post-procedure vital signs reviewed and stable Respiratory status: spontaneous breathing and nonlabored ventilation Cardiovascular status: stable Anesthetic complications: no   No notable events documented.   Last Vitals:  Vitals:   11/04/23 0858 11/04/23 0908  BP: (!) 158/88 132/78  Pulse: 80 73  Resp: 14 16  Temp: (!) 36.1 C   SpO2: 97% 100%    Last Pain:  Vitals:   11/04/23 0908  TempSrc:   PainSc: 0-No pain                 VAN STAVEREN,[name]

## 2023-11-04 NOTE — Op Note (Signed)
[facility] Gastroenterology Patient Name: Don Morgan Procedure Date: 11/04/2023 8:31 AM MRN: [id number] Account #: [id number] Date of Birth: Apr 14, 1961 Admit Type: Outpatient Age: 63 Room: [facility] ENDO ROOM 4 Gender: Male Note Status: Finalized Instrument Name: Prentice Docker [id number] Procedure:             Colonoscopy Indications:           Hematochezia Providers:             Midge Minium MD, MD Referring MD:          No Local Md, MD (Referring MD) Medicines:             Propofol per Anesthesia Complications:         No immediate complications. Procedure:             Pre-Anesthesia Assessment:                        - Prior to the procedure, a History and Physical was                         performed, and patient medications and allergies were                         reviewed. The patient's tolerance of previous                         anesthesia was also reviewed. The risks and benefits                         of the procedure and the sedation options and risks                         were discussed with the patient. All questions were                         answered, and informed consent was obtained. Prior                         Anticoagulants: The patient has taken no anticoagulant                         or antiplatelet agents. ASA Grade Assessment: II - A                         patient with mild systemic disease. After reviewing                         the risks and benefits, the patient was deemed in                         satisfactory condition to undergo the procedure.                        After obtaining informed consent, the colonoscope was                         passed under direct vision. Throughout the procedure,  the patient's blood pressure, pulse, and oxygen                         saturations were monitored continuously. The                         Colonoscope was introduced through the anus and                          advanced to the the cecum, identified by appendiceal                         orifice and ileocecal valve. The colonoscopy was                         performed without difficulty. The patient tolerated                         the procedure well. The quality of the bowel                         preparation was excellent. Findings:      The perianal and digital rectal examinations were normal.      Bleeding external and internal hemorrhoids were found during       retroflexion. The hemorrhoids were Grade II (internal hemorrhoids that       prolapse but reduce spontaneously).      A 6 mm polyp was found in the transverse colon. The polyp was sessile.       The polyp was removed with a cold snare. Resection and retrieval were       complete. Impression:            - Bleeding external and internal hemorrhoids.                        - One 6 mm polyp in the transverse colon, removed with                         a cold snare. Resected and retrieved. Recommendation:        - Discharge patient to home.                        - Resume previous diet.                        - Continue present medications.                        - Await pathology results.                        - If the pathology report reveals adenomatous tissue,                         then repeat the colonoscopy for surveillance in 7                         years.                        -  If bleeding continues then call the office for                         hemorrhoid banding Procedure Code(s):     --- Professional ---                        [phone], Colonoscopy, flexible; with removal of                         tumor(s), polyp(s), or other lesion(s) by snare                         technique Diagnosis Code(s):     --- Professional ---                        K92.1, Melena (includes Hematochezia)                        D12.3, Benign neoplasm of transverse colon (hepatic                         flexure or splenic flexure) CPT  copyright 2022 American Medical Association. All rights reserved. The codes documented in this report are preliminary and upon coder review may  be revised to meet current compliance requirements. Midge Minium MD, MD 11/04/2023 8:55:59 AM This report has been signed electronically. Number of Addenda: 0 Note Initiated On: 11/04/2023 8:31 AM Scope Withdrawal Time: 0 hours 7 minutes 4 seconds  Total Procedure Duration: 0 hours 10 minutes 1 second  Estimated Blood Loss:  Estimated blood loss: none.      [facility]

## 2023-11-07 ENCOUNTER — Encounter: Payer: Self-pay | Admitting: Gastroenterology

## 2023-11-07 LAB — SURGICAL PATHOLOGY

## 2023-11-17 ENCOUNTER — Telehealth: Payer: Self-pay | Admitting: Urology

## 2023-11-17 NOTE — Telephone Encounter (Signed)
 He called today to get his results from his infusion and MRI that he had on Friday at [facility] on Friday. They told him there that we would get in touch with him about the results but Today.

## 2023-11-17 NOTE — Telephone Encounter (Signed)
 Spoke with patient regarding pathology results.  I will call him back on Wednesday following Dr. Mindi Alto recommendations.

## 2023-11-23 ENCOUNTER — Telehealth: Payer: Self-pay | Admitting: Urology

## 2023-11-23 DIAGNOSIS — C61 Malignant neoplasm of prostate: Secondary | ICD-10-CM

## 2023-11-23 NOTE — Telephone Encounter (Signed)
Would you schedule Don Morgan with either Dr. Richardo Hanks or Dr. Apolinar Junes, he would like first available appointment, to discuss robotic prostatectomy for prostate cancer?  He is a Dr. [name] patient, so if the first available appointment is with Dr. Richardo Hanks in [facility], please advise him that the appointment is in the [facility] office.  I also placed a referral to Dr. Malachi Carl office and a Dr. Lequita Halt at [facility] per patient's request.

## 2023-11-28 ENCOUNTER — Other Ambulatory Visit: Payer: Self-pay

## 2023-11-28 ENCOUNTER — Ambulatory Visit
Admission: RE | Admit: 2023-11-28 | Discharge: 2023-11-28 | Disposition: A | Discharge status: Home / Self Care | Payer: Medicare PPO | Source: Ambulatory Visit | Attending: Radiation Oncology | Admitting: Radiation Oncology

## 2023-11-28 VITALS — BP 159/94 | HR 76 | Temp 97.0°F | Resp 16 | Ht 72.0 in | Wt 247.0 lb

## 2023-11-28 DIAGNOSIS — K219 Gastro-esophageal reflux disease without esophagitis: Secondary | ICD-10-CM | POA: Diagnosis not present

## 2023-11-28 DIAGNOSIS — Z79899 Other long term (current) drug therapy: Secondary | ICD-10-CM | POA: Insufficient documentation

## 2023-11-28 DIAGNOSIS — Z87442 Personal history of urinary calculi: Secondary | ICD-10-CM | POA: Diagnosis not present

## 2023-11-28 DIAGNOSIS — E785 Hyperlipidemia, unspecified: Secondary | ICD-10-CM | POA: Insufficient documentation

## 2023-11-28 DIAGNOSIS — Z923 Personal history of irradiation: Secondary | ICD-10-CM | POA: Diagnosis not present

## 2023-11-28 DIAGNOSIS — Z8042 Family history of malignant neoplasm of prostate: Secondary | ICD-10-CM | POA: Diagnosis not present

## 2023-11-28 DIAGNOSIS — G4730 Sleep apnea, unspecified: Secondary | ICD-10-CM | POA: Insufficient documentation

## 2023-11-28 DIAGNOSIS — I10 Essential (primary) hypertension: Secondary | ICD-10-CM | POA: Insufficient documentation

## 2023-11-28 DIAGNOSIS — C61 Malignant neoplasm of prostate: Secondary | ICD-10-CM | POA: Diagnosis present

## 2023-11-28 DIAGNOSIS — Z7982 Long term (current) use of aspirin: Secondary | ICD-10-CM | POA: Diagnosis not present

## 2023-11-28 NOTE — Consult Note (Signed)
 NEW PATIENT EVALUATION  Name: Don Morgan  MRN: [id number]  Date:   11/28/2023     DOB: 07-08-1961   This 63 y.o. 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  REFERRING PHYSICIAN: Harle Battiest, PA-C  CHIEF COMPLAINT:  Chief Complaint  Patient presents with   Consult    DIAGNOSIS: The encounter diagnosis was Malignant neoplasm of prostate (HCC).   PREVIOUS INVESTIGATIONS:  MRI scan reviewed Pathology reports reviewed Clinical notes reviewed  HPI: Patient is a 63 year old male who has been tract for a elevated PSA of 5.8 back in July 2022.  He was underwent biopsy at that time all biopsies were negative.  Recently his PSA his climbed to over 12 had an MRI scan showing PI-RADS category 4 lesion of the right transitional zone in the mid gland.  There is no evidence of pelvic adenopathy or bone metastasis or trans capsular spread.  He had approximately 51 cc prostate.  Went on had targeted biopsy showing 2 out of 12 cores positive for Gleason 7 (4+3).  He has not yet seen urology for discussion of treatment options.  He specifically denies any increased lower urinary tract symptoms diarrhea erectile dysfunction or fatigue.  PLANNED TREATMENT REGIMEN: Probable prostatectomy  PAST MEDICAL HISTORY:  has a past medical history of GERD (gastroesophageal reflux disease), History of kidney stones, Hyperlipidemia, Hypertension, Internal hemorrhoid, bleeding, and OSA (obstructive sleep apnea).    PAST SURGICAL HISTORY:  Past Surgical History:  Procedure Laterality Date   ANTERIOR CERVICAL DISCECTOMY  01/08/2009   C6 -- C7  TOTAL DISK REPLACEMENT   CARDIOVASCULAR STRESS TEST  11-07-2015   dr Juliann Pares [facility] clinic)   normal perfusion nuclear study w/ no ischemia/  normal LV function and wall motion, ef 57%   COLONOSCOPY WITH PROPOFOL N/A 11/04/2023   Procedure:  COLONOSCOPY WITH PROPOFOL;  Surgeon: Midge Minium, MD;  Location: [facility] ENDOSCOPY;  Service: Endoscopy;  Laterality: N/A;   CYSTOSCOPY/RETROGRADE/URETEROSCOPY/STONE EXTRACTION WITH BASKET  2014   EXTRACORPOREAL SHOCK WAVE LITHOTRIPSY  2014   POLYPECTOMY  11/04/2023   Procedure: POLYPECTOMY;  Surgeon: Midge Minium, MD;  Location: [facility] ENDOSCOPY;  Service: Endoscopy;;   TRANSANAL HEMORRHOIDAL DEARTERIALIZATION N/A 01/26/2017   Procedure: TRANSANAL HEMORRHOIDAL DEARTERIALIZATION;  Surgeon: Romie Levee, MD;  Location: [facility];  Service: General;  Laterality: N/A;   TRANSTHORACIC ECHOCARDIOGRAM  11-07-2015   dr Juliann Pares   mild LVH,  ef 55%/  mild MR and TR/  trivial PR    FAMILY HISTORY: family history includes Hematuria in his father; Prostate cancer in his father.  SOCIAL HISTORY:  reports that he has never smoked. He has never used smokeless tobacco. He reports that he does not drink alcohol and does not use drugs.  ALLERGIES: Other and Morphine and codeine  MEDICATIONS:  Current Outpatient Medications  Medication Sig Dispense Refill   atorvastatin (LIPITOR) 40 MG tablet Take 40 mg by mouth daily as needed.     calcium carbonate (TUMS - DOSED IN MG ELEMENTAL CALCIUM) 500 MG chewable tablet Chew 1 tablet by mouth as needed for indigestion or heartburn.     hydrocortisone (ANUSOL-HC) 25 MG suppository Place 1 suppository (25 mg total) rectally at bedtime. 12 suppository 1   lisinopril-hydrochlorothiazide (PRINZIDE,ZESTORETIC) 20-12.5 MG tablet Take 1 tablet by mouth 2 (two) times daily.     omeprazole (PRILOSEC OTC) 20 MG tablet Take  20 mg by mouth daily.     triamcinolone ointment (KENALOG) 0.5 % SMARTSIG:1 Topical Daily PRN     ABRYSVO 120 MCG/0.5ML injection      aspirin EC 81 MG tablet Take 81 mg by mouth daily. (Patient not taking: Reported on 11/28/2023)     CIALIS 20 MG tablet Take by mouth.     clobetasol cream (TEMOVATE) 0.05 %      sildenafil (VIAGRA) 100 MG  tablet Take by mouth. (Patient not taking: Reported on 11/28/2023)     terbinafine (LAMISIL) 250 MG tablet Take 250 mg by mouth daily.     No current facility-administered medications for this encounter.    ECOG PERFORMANCE STATUS:  0 - Asymptomatic  REVIEW OF SYSTEMS: Patient denies any weight loss, fatigue, weakness, fever, chills or night sweats. Patient denies any loss of vision, blurred vision. Patient denies any ringing  of the ears or hearing loss. No irregular heartbeat. Patient denies heart murmur or history of fainting. Patient denies any chest pain or pain radiating to her upper extremities. Patient denies any shortness of breath, difficulty breathing at night, cough or hemoptysis. Patient denies any swelling in the lower legs. Patient denies any nausea vomiting, vomiting of blood, or coffee ground material in the vomitus. Patient denies any stomach pain. Patient states has had normal bowel movements no significant constipation or diarrhea. Patient denies any dysuria, hematuria or significant nocturia. Patient denies any problems walking, swelling in the joints or loss of balance. Patient denies any skin changes, loss of hair or loss of weight. Patient denies any excessive worrying or anxiety or significant depression. Patient denies any problems with insomnia. Patient denies excessive thirst, polyuria, polydipsia. Patient denies any swollen glands, patient denies easy bruising or easy bleeding. Patient denies any recent infections, allergies or URI. Patient "s visual fields have not changed significantly in recent time.   PHYSICAL EXAM: BP (!) 159/94   Pulse 76   Temp (!) 97 F (36.1 C)   Resp 16   Ht 6' (1.829 m)   Wt 247 lb (112 kg)   BMI 33.50 kg/m  Well-developed well-nourished patient in NAD. HEENT reveals PERLA, EOMI, discs not visualized.  Oral cavity is clear. No oral mucosal lesions are identified. Neck is clear without evidence of cervical or supraclavicular adenopathy.  Lungs are clear to A&P. Cardiac examination is essentially unremarkable with regular rate and rhythm without murmur rub or thrill. Abdomen is benign with no organomegaly or masses noted. Motor sensory and DTR levels are equal and symmetric in the upper and lower extremities. Cranial nerves II through XII are grossly intact. Proprioception is intact. No peripheral adenopathy or edema is identified. No motor or sensory levels are noted. Crude visual fields are within normal range.  LABORATORY DATA: Pathology reports reviewed   RADIOLOGY RESULTS:MRI scan reviewed compatible with above-stated findings    IMPRESSION: Stage IIc Gleason 7 (4+3) adenocarcinoma the prostate in 63 year old male  PLAN: At this time I have gone over recommendations for treatment.  I gone over both radiation therapy treatments including 6 seed implantation as well as image guided IMRT treatment planning.  I have also briefly reviewed surgical options as far as robotic assisted prostatectomy.  I have explained to him based on his excellent health and young age I would favor prostatectomy at this time since we can always use radiation for salvage should he had biochemical failure after surgery.  I have also explained the converse is not true limited options are limited  once we do radiation.  He will be seeing urology later this week I believe he also has a second opinion at [facility].  Risks and benefits of treatment were reviewed with the patient.  I believe he will opt for prostatectomy.  I would like to take this opportunity to thank you for allowing me to participate in the care of your patient.Carmina Miller, MD

## 2023-11-30 ENCOUNTER — Encounter: Payer: Self-pay | Admitting: Urology

## 2023-11-30 ENCOUNTER — Ambulatory Visit: Payer: Medicare PPO | Admitting: Urology

## 2023-11-30 VITALS — BP 151/89 | HR 76 | Ht 72.0 in | Wt 246.0 lb

## 2023-11-30 DIAGNOSIS — C61 Malignant neoplasm of prostate: Secondary | ICD-10-CM | POA: Diagnosis not present

## 2023-11-30 DIAGNOSIS — Z8042 Family history of malignant neoplasm of prostate: Secondary | ICD-10-CM | POA: Diagnosis not present

## 2023-11-30 LAB — URINALYSIS, COMPLETE
Bilirubin, UA: NEGATIVE
Glucose, UA: NEGATIVE
Ketones, UA: NEGATIVE
Leukocytes,UA: NEGATIVE
Nitrite, UA: NEGATIVE
Protein,UA: NEGATIVE
RBC, UA: NEGATIVE
Specific Gravity, UA: 1.02 (ref 1.005–1.030)
Urobilinogen, Ur: 0.2 mg/dL (ref 0.2–1.0)
pH, UA: 7 (ref 5.0–7.5)

## 2023-11-30 LAB — MICROSCOPIC EXAMINATION

## 2023-11-30 NOTE — Progress Notes (Signed)
 [name],acting as a scribe for Vanna Scotland, MD.,have documented all relevant documentation on the behalf of Vanna Scotland, MD,as directed by  Vanna Scotland, MD while in the presence of Vanna Scotland, MD.  11/30/23 11:13 AM   Don Morgan [birth year] [id number]  Referring provider: No referring provider defined for this encounter.  Chief Complaint  Patient presents with   Other    HPI: 63 year-old male with newly diagnosed intermediate risk prostate cancer who presents today to discuss surgical options. He has a personal history of elevated PSA and has had a negative prostate biopsy in 2022.   His PSA was known to continue to rise up to 12. He had a prostate MRI that showed a PI-RADS 4 lesion. He underwent fusion biopsy that showed 2 cores of Gleason 4+3, involving 20% of the right mid and right mid-lateral lesions. He also had 3 samples taken out of the area or interest, all of which showed the same Gleason 4+3 disease, up to 30%. His prostate volume on MRI was 51.5. There is no evidence of adenopathy or transcapsular spread.   His BMI is 33.3. He reports no previous abdominal surgeries.  He has been seen by Dr. Rushie Chestnut on consultation to discuss his options.   Today, he denies any urinary issues. IPSS is 10/2, SHIM is 20. He currently takes sildenafil.   Family history is significant for his father, who had prostate cancer that eventually metastasized to bone cancer and passed away at age 56.     PMH: Past Medical History:  Diagnosis Date   GERD (gastroesophageal reflux disease)    History of kidney stones    Hyperlipidemia    Hypertension    Internal hemorrhoid, bleeding    OSA (obstructive sleep apnea)    per pt has not gone back to get fiited for cpap yet---  was recommeded cpap and wt loss    Surgical History: Past Surgical History:  Procedure Laterality Date   ANTERIOR CERVICAL DISCECTOMY  01/08/2009   C6 -- C7  TOTAL DISK REPLACEMENT    CARDIOVASCULAR STRESS TEST  11-07-2015   dr Juliann Pares [facility] clinic)   normal perfusion nuclear study w/ no ischemia/  normal LV function and wall motion, ef 57%   COLONOSCOPY WITH PROPOFOL N/A 11/04/2023   Procedure: COLONOSCOPY WITH PROPOFOL;  Surgeon: Midge Minium, MD;  Location: [facility] ENDOSCOPY;  Service: Endoscopy;  Laterality: N/A;   CYSTOSCOPY/RETROGRADE/URETEROSCOPY/STONE EXTRACTION WITH BASKET  2014   EXTRACORPOREAL SHOCK WAVE LITHOTRIPSY  2014   POLYPECTOMY  11/04/2023   Procedure: POLYPECTOMY;  Surgeon: Midge Minium, MD;  Location: [facility] ENDOSCOPY;  Service: Endoscopy;;   TRANSANAL HEMORRHOIDAL DEARTERIALIZATION N/A 01/26/2017   Procedure: TRANSANAL HEMORRHOIDAL DEARTERIALIZATION;  Surgeon: Romie Levee, MD;  Location: [facility];  Service: General;  Laterality: N/A;   TRANSTHORACIC ECHOCARDIOGRAM  11-07-2015   dr Juliann Pares   mild LVH,  ef 55%/  mild MR and TR/  trivial PR    Home Medications:  Allergies as of 11/30/2023       Reactions   Other    Other reaction(s): Unknown   Morphine And Codeine Rash        Medication List        Accurate as of November 30, 2023 11:13 AM. If you have any questions, ask your nurse or doctor.          Abrysvo 120 MCG/0.5ML injection Generic drug: RSV bivalent vaccine   aspirin EC 81 MG tablet Take 81  mg by mouth daily.   atorvastatin 40 MG tablet Commonly known as: LIPITOR Take 40 mg by mouth daily as needed.   calcium carbonate 500 MG chewable tablet Commonly known as: TUMS - dosed in mg elemental calcium Chew 1 tablet by mouth as needed for indigestion or heartburn.   Cialis 20 MG tablet Generic drug: tadalafil Take by mouth.   clobetasol cream 0.05 % Commonly known as: TEMOVATE   hydrocortisone 25 MG suppository Commonly known as: ANUSOL-HC Place 1 suppository (25 mg total) rectally at bedtime.   lisinopril-hydrochlorothiazide 20-12.5 MG tablet Commonly known as: ZESTORETIC Take 1 tablet by mouth 2  (two) times daily.   omeprazole 20 MG tablet Commonly known as: PRILOSEC OTC Take 20 mg by mouth daily.   sildenafil 100 MG tablet Commonly known as: VIAGRA Take by mouth.   terbinafine 250 MG tablet Commonly known as: LAMISIL Take 250 mg by mouth daily.   triamcinolone ointment 0.5 % Commonly known as: KENALOG SMARTSIG:1 Topical Daily PRN        Allergies:  Allergies  Allergen Reactions   Other     Other reaction(s): Unknown   Morphine And Codeine Rash    Family History: Family History  Problem Relation Age of Onset   Prostate cancer Father    Hematuria Father     Social History:  reports that he has never smoked. He has never used smokeless tobacco. He reports that he does not drink alcohol and does not use drugs.   Physical Exam: BP (!) 151/89   Pulse 76   Ht 6' (1.829 m)   Wt 246 lb (111.6 kg)   BMI 33.36 kg/m   Constitutional:  Alert and oriented, No acute distress. HEENT: [place] AT, moist mucus membranes.  Trachea midline, no masses. Abdomen: Soft, protuberant, no hernias Neurologic: Grossly intact, no focal deficits, moving all 4 extremities. Psychiatric: Normal mood and affect.   Assessment & Plan:    1. Prostate cancer - Unfavorable, intermediate risk   - The patient was counseled about the natural history of prostate cancer and the standard treatment options that are available for prostate cancer. It was explained to him how his age and life expectancy, clinical stage, Gleason score, and PSA affect his prognosis, the decision to proceed with additional staging studies, as well as how that information influences recommended treatment strategies. We discussed the roles for active surveillance, radiation therapy, surgical therapy, androgen deprivation, as well as ablative therapy options for the treatment of prostate cancer as appropriate to his individual cancer situation. We discussed the risks and benefits of these options with regard to their impact  on cancer control and also in terms of potential adverse events, complications, and impact on quality of life particularly related to urinary, bowel, and sexual function. The patient was encouraged to ask questions throughout the discussion today and all questions were answered to his stated satisfaction. In addition, the patient was provided with and/or directed to appropriate resources and literature for further education about prostate cancer treatment options.  We discussed surgical therapy for prostate cancer including the different available surgical approaches.  Specifically, we discussed robotic prostatectomy with pelvic lymph node dissection based on his restratification.  We discussed, in detail, the risks and expectations of surgery with regard to cancer control, urinary control, and erectile dysfunction as well as expected post operative recovery processed. Additional risks of surgery including but not limited to bleeding, infection, hernia formation, nerve damage, fistula formation, bowel/rectal injury, potentially necessitating colostomy, damage  to the urinary tract resulting in urinary leakage, urethral stricture, and cardiopulmonary risk such as myocardial infarction, stroke, death, thromboembolism etc. were explained.   - Surgery is recommended due to potential long-term benefits and patient's preference. Plan for robotic-assisted laparoscopic prostatectomy with bilateral pelvic lymph node dissection  - PET scan to rule out metastasis if insurance approves given unfavorable intermediate risk category  - Refer to physical therapy for pelvic floor strengthening exercises. Encourage weight loss to improve surgical outcomes.  - Post-operative: Discuss potential for erectile dysfunction and incontinence. Plan for penile rehabilitation with vacuum erection device and daily Viagra post-surgery.   - Consider partial nerve-sparing on the right side during surgery.  Full nerve sparing on the  left.   Return in about 1 month (around 12/28/2023) for review of PET scan results and confirm surgery scheduling.   [facility] [address], [address] [place], [place] [address] [phone]  I spent 44 total minutes on the day of the encounter including pre-visit review of the medical record, face-to-face time with the patient, and post visit ordering of labs/imaging/tests.

## 2023-12-01 NOTE — Progress Notes (Deleted)
 Celso Amy, PA-C [address]  [address]  [place], [place] [address]  Main: [phone]  Fax: [phone]   Primary Care Physician: Pcp, No  Primary Gastroenterologist:  ***  CC:  HPI: Khamani Fairley Brabender is a 63 y.o. male  Current Outpatient Medications  Medication Sig Dispense Refill   ABRYSVO 120 MCG/0.5ML injection      aspirin EC 81 MG tablet Take 81 mg by mouth daily.     atorvastatin (LIPITOR) 40 MG tablet Take 40 mg by mouth daily as needed.     calcium carbonate (TUMS - DOSED IN MG ELEMENTAL CALCIUM) 500 MG chewable tablet Chew 1 tablet by mouth as needed for indigestion or heartburn.     CIALIS 20 MG tablet Take by mouth.     clobetasol cream (TEMOVATE) 0.05 %      hydrocortisone (ANUSOL-HC) 25 MG suppository Place 1 suppository (25 mg total) rectally at bedtime. 12 suppository 1   lisinopril-hydrochlorothiazide (PRINZIDE,ZESTORETIC) 20-12.5 MG tablet Take 1 tablet by mouth 2 (two) times daily.     omeprazole (PRILOSEC OTC) 20 MG tablet Take 20 mg by mouth daily.     sildenafil (VIAGRA) 100 MG tablet Take by mouth.     terbinafine (LAMISIL) 250 MG tablet Take 250 mg by mouth daily.     triamcinolone ointment (KENALOG) 0.5 % SMARTSIG:1 Topical Daily PRN     No current facility-administered medications for this visit.    Allergies as of 12/02/2023 - Review Complete 11/30/2023  Allergen Reaction Noted   Other  10/31/2015   Morphine and codeine Rash 01/11/2016    Past Medical History:  Diagnosis Date   GERD (gastroesophageal reflux disease)    History of kidney stones    Hyperlipidemia    Hypertension    Internal hemorrhoid, bleeding    OSA (obstructive sleep apnea)    per pt has not gone back to get fiited for cpap yet---  was recommeded cpap and wt loss    Past Surgical History:  Procedure Laterality Date   ANTERIOR CERVICAL DISCECTOMY  01/08/2009   C6 -- C7  TOTAL DISK REPLACEMENT   CARDIOVASCULAR STRESS TEST  11-07-2015   dr Juliann Pares [facility]  clinic)   normal perfusion nuclear study w/ no ischemia/  normal LV function and wall motion, ef 57%   COLONOSCOPY WITH PROPOFOL N/A 11/04/2023   Procedure: COLONOSCOPY WITH PROPOFOL;  Surgeon: Midge Minium, MD;  Location: [facility] ENDOSCOPY;  Service: Endoscopy;  Laterality: N/A;   CYSTOSCOPY/RETROGRADE/URETEROSCOPY/STONE EXTRACTION WITH BASKET  2014   EXTRACORPOREAL SHOCK WAVE LITHOTRIPSY  2014   POLYPECTOMY  11/04/2023   Procedure: POLYPECTOMY;  Surgeon: Midge Minium, MD;  Location: [facility] ENDOSCOPY;  Service: Endoscopy;;   TRANSANAL HEMORRHOIDAL DEARTERIALIZATION N/A 01/26/2017   Procedure: TRANSANAL HEMORRHOIDAL DEARTERIALIZATION;  Surgeon: Romie Levee, MD;  Location: [facility];  Service: General;  Laterality: N/A;   TRANSTHORACIC ECHOCARDIOGRAM  11-07-2015   dr Juliann Pares   mild LVH,  ef 55%/  mild MR and TR/  trivial PR    Review of Systems:    All systems reviewed and negative except where noted in HPI.   Physical Examination:   There were no vitals taken for this visit.  General: Well-nourished, well-developed in no acute distress.  Lungs: Clear to auscultation bilaterally. Non-labored. Heart: Regular rate and rhythm, no murmurs rubs or gallops.  Abdomen: Bowel sounds are normal; Abdomen is Soft; No hepatosplenomegaly, masses or hernias;  No Abdominal Tenderness; No guarding or rebound tenderness. Neuro: Alert  and oriented x 3.  Grossly intact.  Psych: Alert and cooperative, normal mood and affect.   Imaging Studies: No results found.  Assessment and Plan:   Samuell Knoble Diiorio is a 63 y.o. y/o male ***    Celso Amy, PA-C  Follow up ***  BP check ***

## 2023-12-02 ENCOUNTER — Other Ambulatory Visit: Payer: Self-pay

## 2023-12-02 ENCOUNTER — Ambulatory Visit: Payer: Medicare PPO | Admitting: Physician Assistant

## 2023-12-02 DIAGNOSIS — C61 Malignant neoplasm of prostate: Secondary | ICD-10-CM

## 2023-12-02 NOTE — Progress Notes (Signed)
 Surgical Physician Order Form [facility] [place]  * Scheduling expectation : Next Available  *Length of Case:   *Clearance needed: no  *Anticoagulation Instructions: Hold all anticoagulants  *Aspirin Instructions: Hold Aspirin  *Post-op visit Date/Instructions: 7 days Foley removal, 6 weeks with PSA prior  *Diagnosis: Prostate Cancer  *Procedure: bilateral pelvic LN dissection, Robotic laparoscopic Prostatectomy ([id number])   Additional orders: N/A  -Admit type: Observation  -Anesthesia: General  -VTE Prophylaxis Standing Order SCD's       Other:   -Standing Lab Orders Per Anesthesia    Lab other: UA/urine culture, CBC, BMP, INR, type and screen  -Standing Test orders EKG/Chest x-ray per Anesthesia       Test other:   - Medications:  Ancef 2gm IV  -Other orders:  N/A

## 2023-12-05 ENCOUNTER — Telehealth: Payer: Self-pay

## 2023-12-05 NOTE — Progress Notes (Signed)
   [facility] Surgical Posting Form  Surgery Date: Date: 01/30/2024  Surgeon: Dr. Vanna Scotland, MD  Inpt ( No  )   Outpt (No)   Obs ( Yes  )   Diagnosis: C61 Prostate Cancer   -CPT: [phone], [phone]  Surgery: Robotic Laparoscopic Radical Prostatectomy with Bilateral Pelvic Lymph Node Dissection  Stop Anticoagulations: Yes and also hold ASA  Cardiac/Medical/Pulmonary Clearance needed: no  *Orders entered into EPIC  Date: 12/05/23   *Case booked in [place]  Date: 12/02/2023  *Notified pt of Surgery: Date: 12/02/2023  PRE-OP UA & CX: yes and will also obtain CBC, BMP, INR, Type and Screen  *Placed into Prior Authorization Work [place] Date: 12/05/23  Assistant/laser/rep:No

## 2023-12-05 NOTE — Telephone Encounter (Signed)
  Per Dr. Apolinar Junes,  Patient is to be scheduled for  Robotic Laparoscopic Radical Prostatectomy with Bilateral Pelvic Lymph Node Dissection   Mr. Don Morgan was contacted and possible surgical dates were discussed, Monday April 21st, 2025 was agreed upon for surgery.   Patient was directed to call [phone] between 1-3pm the day before surgery to find out surgical arrival time.  Instructions were given not to eat or drink from midnight on the night before surgery and have a driver for the day of surgery. On the surgery day patient was instructed to enter through the [facility] entrance of [facility] report the Same Day Surgery desk.   Pre-Admit Testing will be in contact via phone to set up an interview with the anesthesia team to review your history and medications prior to surgery.   Reminder of this information was sent via Mailed to the patient.

## 2023-12-07 ENCOUNTER — Ambulatory Visit
Admission: RE | Admit: 2023-12-07 | Discharge: 2023-12-07 | Disposition: A | Discharge status: Home / Self Care | Payer: Medicare PPO | Source: Ambulatory Visit | Attending: Urology | Admitting: Urology

## 2023-12-07 DIAGNOSIS — C61 Malignant neoplasm of prostate: Secondary | ICD-10-CM

## 2023-12-07 DIAGNOSIS — N200 Calculus of kidney: Secondary | ICD-10-CM | POA: Diagnosis not present

## 2023-12-07 MED ORDER — FLOTUFOLASTAT F 18 GALLIUM 296-5846 MBQ/ML IV SOLN
8.0000 | Freq: Once | INTRAVENOUS | Status: AC
  Administered 2023-12-07: 8.45 via INTRAVENOUS
  Filled 2023-12-07: qty 8

## 2023-12-14 ENCOUNTER — Telehealth: Payer: Self-pay | Admitting: Urology

## 2023-12-14 NOTE — Telephone Encounter (Signed)
Error

## 2023-12-14 NOTE — Progress Notes (Unsigned)
 Don Amy, PA-C [address]  [address]  [place], [place] [address]  Main: [phone]  Fax: [phone]   Primary Care Physician: Pcp, No  Primary Gastroenterologist:  Don Amy, PA-C / Dr. Midge Minium    CC: Follow-up rectal bleeding, Hemorrhoids, and Constipation  HPI: Don Morgan is a 63 y.o. male returns for 27-month follow-up of rectal bleeding.  History of hemorrhoids and irregular bowel habits.  Was treated with hydrocortisone suppositories and Benefiber powder.  Recent CBC was normal with hemoglobin 13.8 g, no anemia.  11/04/2023 colonoscopy by Dr. Servando Snare: 1 small 6 mm tubular adenoma polyp removed from transverse colon.  Bleeding internal and external hemorrhoids, grade 2.  7-year repeat colonoscopy was recommended.  Patient reports having external hemorrhoid surgery many years ago in [place] which was very painful.  He does not want to have anymore hemorrhoid surgeries.  He is scheduled for prostate surgery due to prostate cancer in April of this year.  He states his prostate is very enlarged.  Continues to have mild constipation.  Takes OTC Dulcolax as needed.  No other treatment.  Hemorrhoids have currently improved.  He has not had any more bleeding in the past 2 weeks.  Current Outpatient Medications  Medication Sig Dispense Refill   ABRYSVO 120 MCG/0.5ML injection      aspirin EC 81 MG tablet Take 81 mg by mouth daily.     atorvastatin (LIPITOR) 40 MG tablet Take 40 mg by mouth daily as needed.     calcium carbonate (TUMS - DOSED IN MG ELEMENTAL CALCIUM) 500 MG chewable tablet Chew 1 tablet by mouth as needed for indigestion or heartburn.     CIALIS 20 MG tablet Take by mouth.     clobetasol cream (TEMOVATE) 0.05 %      hydrocortisone (ANUSOL-HC) 25 MG suppository Place 1 suppository (25 mg total) rectally at bedtime. 12 suppository 1   lisinopril-hydrochlorothiazide (PRINZIDE,ZESTORETIC) 20-12.5 MG tablet Take 1 tablet by mouth 2 (two) times daily.      omeprazole (PRILOSEC OTC) 20 MG tablet Take 20 mg by mouth daily.     sildenafil (VIAGRA) 100 MG tablet Take by mouth.     terbinafine (LAMISIL) 250 MG tablet Take 250 mg by mouth daily.     triamcinolone ointment (KENALOG) 0.5 % SMARTSIG:1 Topical Daily PRN     No current facility-administered medications for this visit.    Allergies as of 12/15/2023 - Review Complete 12/15/2023  Allergen Reaction Noted   Other  10/31/2015   Morphine and codeine Rash 01/11/2016    Past Medical History:  Diagnosis Date   GERD (gastroesophageal reflux disease)    History of kidney stones    Hyperlipidemia    Hypertension    Internal hemorrhoid, bleeding    OSA (obstructive sleep apnea)    per pt has not gone back to get fiited for cpap yet---  was recommeded cpap and wt loss    Past Surgical History:  Procedure Laterality Date   ANTERIOR CERVICAL DISCECTOMY  01/08/2009   C6 -- C7  TOTAL DISK REPLACEMENT   CARDIOVASCULAR STRESS TEST  11-07-2015   dr Juliann Pares [facility] clinic)   normal perfusion nuclear study w/ no ischemia/  normal LV function and wall motion, ef 57%   COLONOSCOPY WITH PROPOFOL N/A 11/04/2023   Procedure: COLONOSCOPY WITH PROPOFOL;  Surgeon: Midge Minium, MD;  Location: [facility] ENDOSCOPY;  Service: Endoscopy;  Laterality: N/A;   CYSTOSCOPY/RETROGRADE/URETEROSCOPY/STONE EXTRACTION WITH BASKET  2014   EXTRACORPOREAL  SHOCK WAVE LITHOTRIPSY  2014   POLYPECTOMY  11/04/2023   Procedure: POLYPECTOMY;  Surgeon: Midge Minium, MD;  Location: [facility] ENDOSCOPY;  Service: Endoscopy;;   TRANSANAL HEMORRHOIDAL DEARTERIALIZATION N/A 01/26/2017   Procedure: TRANSANAL HEMORRHOIDAL DEARTERIALIZATION;  Surgeon: Romie Levee, MD;  Location: [facility];  Service: General;  Laterality: N/A;   TRANSTHORACIC ECHOCARDIOGRAM  11-07-2015   dr Juliann Pares   mild LVH,  ef 55%/  mild MR and TR/  trivial PR    Review of Systems:    All systems reviewed and negative except where noted in HPI.    Physical Examination:   BP (!) 164/83   Pulse 71   Temp 98.3 F (36.8 C)   Ht 6\' 6"  (1.981 m)   Wt 249 lb 3.2 oz (113 kg)   BMI 28.80 kg/m   General: Well-nourished, well-developed in no acute distress.  Neuro: Alert and oriented x 3.  Grossly intact.  Psych: Alert and cooperative, normal mood and affect.  Imaging Studies: No results found.  Assessment and Plan:   Don Morgan is a 63 y.o. y/o male returns for follow-up of rectal bleeding.  CBC showed no anemia.  Colonoscopy showed moderate bleeding internal and external hemorrhoids.  Currently improved with hydrocortisone suppositories.    1.  Internal hemorrhoids moderate  I offered hemorrhoid banding procedure with Dr. Tobi Bastos or Dr. Allegra Lai,, however patient declined until after he finishes treatment for prostate cancer.  Continue hydrocortisone cream, Tucks pads, Preparation H, witch hazel wipes, symptomatic treatment as needed.  2.  External hemorrhoids moderate  Hydrocortisone cream/suppositories  Stressed the importance of treating underlying constipation.  3.  Chronic constipation  Start MiraLAX powder 1 or 2 capfuls in a drink every day consistently.  Drink 64 ounces of water daily.  High-fiber diet with fruits and vegetables.  4.  Adenomatous colon polyp  7-year repeat colonoscopy will be due 10/2030.  Don Amy, PA-C  Follow up As needed.

## 2023-12-15 ENCOUNTER — Encounter: Payer: Self-pay | Admitting: Physician Assistant

## 2023-12-15 ENCOUNTER — Ambulatory Visit: Payer: Medicare PPO | Admitting: Physician Assistant

## 2023-12-15 VITALS — BP 164/83 | HR 71 | Temp 98.3°F | Ht 78.0 in | Wt 249.2 lb

## 2023-12-15 DIAGNOSIS — Z860101 Personal history of adenomatous and serrated colon polyps: Secondary | ICD-10-CM | POA: Diagnosis not present

## 2023-12-15 DIAGNOSIS — K644 Residual hemorrhoidal skin tags: Secondary | ICD-10-CM

## 2023-12-15 DIAGNOSIS — K5909 Other constipation: Secondary | ICD-10-CM

## 2023-12-15 DIAGNOSIS — K648 Other hemorrhoids: Secondary | ICD-10-CM

## 2023-12-15 DIAGNOSIS — K5904 Chronic idiopathic constipation: Secondary | ICD-10-CM

## 2023-12-15 DIAGNOSIS — Z860100 Personal history of colon polyps, unspecified: Secondary | ICD-10-CM

## 2023-12-15 NOTE — Patient Instructions (Signed)
   For constipation: Start OTC Miralax Powder Mix 1 capful in 6 to 8 ounces of a drink once daily  Recommend high-fiber diet, 30 g of fiber daily Eat fruits, vegetables, and whole grains Drink 64 ounces of water / fluids daily.   Treatment For External Hemorrhoids: Warm water sitz bath with epsom salt for flare up of external hemorrhoids. Use OTC Preparation H, Tucks Pads, and Witch Hazel wipes as needed. Rx: Hydrocortisone Cream 2.5% Apply 2-3 times daily as needed.  For Internal Hemorrhoids: Rx Hydrocortisone Suppositories 25mg  Insert 1 into rectum once daily at bedtime for 10-14 days. Stressed importance of treating underlying constipation. Avoid Sitting on the toilet for prolonged amount of time. Discussed Internal Hemorrhoid Banding if no improvement with conservative treament.

## 2023-12-27 ENCOUNTER — Ambulatory Visit: Payer: Medicare PPO | Admitting: Urology

## 2024-01-04 NOTE — Progress Notes (Signed)
 New Patient Visit   Chief Complaint: Chief Complaint  Patient presents with  . Angina at rest  . Form- Pre-op Clearance     Prostate cancer Fax notes and EKG   Date of Service: 01/04/2024 Date of Birth: 1961/05/05 PCP: Provider No address on file  History of Present Illness:  History of Present Illness Don Morgan is a 63 year old male with hypertension and hyperlipidemia who presents for pre-operative cardiovascular evaluation.  He is scheduled for prostate surgery on April 24th. No chest pain, palpitations, or significant shortness of breath during exertion. He can walk around the block and up a flight of stairs without stopping or experiencing symptoms.  He has a history of hypertension with typical blood pressure readings around 138 mmHg. No significant symptoms related to hypertension such as chest pain or shortness of breath.  He has borderline high cholesterol and was recently started on medication.  He acknowledges a decrease in physical activity recently and wants to return to his previous level of fitness. Two years ago, he was 45 pounds lighter and walked six miles daily, which he attributes to feeling better at that time.   Past Medical and Surgical History  Past Medical History Past Medical History:  Diagnosis Date  . GERD (gastroesophageal reflux disease)   . Hyperlipidemia   . Hypertension     Past Surgical History He has no past surgical history on file.   Medications and Allergies  Current Medications  Current Outpatient Medications on File Prior to Visit  Medication Sig Dispense Refill  . atorvastatin (LIPITOR) 20 MG tablet Take 20 mg by mouth once daily    . lisinopril-hydrochlorothiazide (PRINZIDE,ZESTORETIC) 20-12.5 mg tablet TAKE (2) TABLETS BY MOUTH ONCE DAILY. (Patient taking differently: Take 1 tablet by mouth once daily) 60 tablet 0  . aspirin 81 MG EC tablet Take 81 mg by mouth once daily. (Patient not taking: Reported on 05/27/2020)    .  nitroGLYcerin (NITROSTAT) 0.4 MG SL tablet Place 0.4 mg under the tongue every 5 (five) minutes as needed for Chest pain May take up to 3 doses. (Patient not taking: Reported on 01/04/2024)     No current facility-administered medications on file prior to visit.    Allergies: Morphine (bulk)  Social and Family History  Social History  reports that he has never smoked. He has never used smokeless tobacco.  Family History No family history on file.  Review of Systems   Review of Systems  Cardiovascular:  Negative for chest pain, palpitations, orthopnea, claudication, leg swelling and PND.      Physical Examination   Vitals:BP (!) 144/82 (BP Location: Left upper arm, Patient Position: Sitting, BP Cuff Size: Adult)   Pulse 75   Resp 16   Ht 182.9 cm (6')   Wt (!) 113.4 kg (250 lb)   SpO2 98%   BMI 33.91 kg/m  Ht:182.9 cm (6') Wt:(!) 113.4 kg (250 lb) ADJ:Anib surface area is 2.4 meters squared. Body mass index is 33.91 kg/m.  Physical Exam Vitals reviewed.  HENT:     Head: Normocephalic and atraumatic.  Cardiovascular:     Rate and Rhythm: Normal rate and regular rhythm.     Pulses: Normal pulses.     Heart sounds: Normal heart sounds. No murmur heard. Pulmonary:     Effort: Pulmonary effort is normal.     Breath sounds: Normal breath sounds.  Abdominal:     General: Bowel sounds are normal.  Musculoskeletal:     Right lower  leg: No edema.     Left lower leg: No edema.  Skin:    General: Skin is warm and dry.  Neurological:     Mental Status: He is alert.       Data & Results   No results for input(s): CHOLTOTAL, HDL, LDLCALC, LDLDIRECT, LDL, VLDL, TRIG in the last 73719 hours.  Recent Labs    12/28/23 1246  NA 138  K 3.6  BUN 10  CREATININE 0.9  CO2 27  GLUCOSE 121    Recent Labs    12/28/23 1246  WBC 6.5  HGB 14.3  HCT 42.7  MCV 82  PLT 389    Recent Labs    12/28/23 1246  INR 1.1        Assessment   63 y.o. male  with  Encounter Diagnoses  Name Primary?  . Pre-operative clearance Yes  . HTN, goal below 130/80   . Mixed hyperlipidemia         Plan   Orders Placed This Encounter  Procedures  . ECG 12-lead   Assessment & Plan Preoperative Cardiovascular Evaluation Scheduled for prostate surgery on April 24th, requiring preoperative cardiovascular clearance. Hypertension and borderline hyperlipidemia present. Reports no significant symptoms such as angina, palpitations, or dyspnea on exertion. Capable of walking around the block and climbing a flight of stairs without symptoms. EKG today was normal. Given his functional capacity and normal EKG, no additional cardiac testing is needed for surgical clearance. - Clear for surgery without additional cardiac testing.  Hypertension Hypertension usually controlled with blood pressure readings around 138 mmHg systolic. Today's reading was elevated. He is aware of his condition and its management. - Monitor blood pressure regularly.  Hyperlipidemia Borderline hyperlipidemia, recently initiated on treatment. He is aware of his condition and the need for management. - Continue current management for hyperlipidemia.  Decreased Physical Activity Reports decreased physical activity over the past two years, resulting in a weight gain of 45 pounds. Previously walked 6 miles daily and acknowledges the need to return to regular exercise to improve overall health. - Encourage gradual return to regular walking and physical activity.  Return in about 3 months (around 04/05/2024).   Attestation Statement:   I personally performed the service, non-incident to. (WP)   SABINA CUSTOVIC, DO    Sabina Custovic, DO

## 2024-01-16 ENCOUNTER — Telehealth: Payer: Self-pay

## 2024-01-16 NOTE — Telephone Encounter (Signed)
 Patient called in and has decided to have his surgery with [facility], I have cancelled his surgery here at [facility] and all follow up appts.

## 2024-01-23 ENCOUNTER — Other Ambulatory Visit: Payer: Medicare PPO

## 2024-01-30 ENCOUNTER — Ambulatory Visit: Admit: 2024-01-30 | Payer: Medicare PPO | Admitting: Urology

## 2024-01-30 SURGERY — PROSTATECTOMY, RADICAL, ROBOT-ASSISTED, LAPAROSCOPIC
Anesthesia: General

## 2024-02-06 ENCOUNTER — Encounter: Payer: Medicare PPO | Admitting: Physician Assistant

## 2024-02-07 ENCOUNTER — Encounter: Admitting: Physician Assistant

## 2024-03-09 ENCOUNTER — Other Ambulatory Visit: Payer: Medicare PPO

## 2024-03-13 ENCOUNTER — Ambulatory Visit: Payer: Medicare PPO | Admitting: Urology

## 2024-03-22 NOTE — Progress Notes (Signed)
 New Patient Visit   Chief Complaint: Chief Complaint  Patient presents with  . Leg Swelling  . Follow-up   Date of Service: 03/22/2024 Date of Birth: Dec 30, 1960 PCP: Provider No address on file  History of Present Illness:  History of Present Illness Don Morgan is a 63 year old male with hypertension and hyperlipidemia who presents for follow-up after prostate surgery.  He is recovering from prostate surgery during which 25 lymph nodes were removed. He experienced numbness and a sensation of 'little needles' in his legs, which has improved from initially affecting both legs. The numbness was attributed to possible positioning during surgery or nerve involvement during lymph node removal.  The lymph nodes were negative for cancer, and the prostate was removed due to aggressive and fast-moving cancer. His PSA level is now 0.01.  He experiences swelling in his legs, particularly around the ankles, which worsens throughout the day.  Frequent urination, especially at night, has reduced from up to 20 times to 1-2 times per night. He manages incontinence with one Depend per day, unless he has a coughing spell. He avoids drinking fluids after 5 or 6 PM to reduce nighttime urination.  Soreness in the abdominal area requires him to use a pillow for support when getting up. He can now get out of bed without leakage and has improved control over urination.  No chest pain or shortness of breath.   Past Medical and Surgical History  Past Medical History Past Medical History:  Diagnosis Date  . GERD (gastroesophageal reflux disease)   . Hyperlipidemia   . Hypertension   . Motion sickness   . Prostate cancer (CMS/HHS-HCC)     Past Surgical History He has a past surgical history that includes ACDF (2010); Neuroplasty Ulnar Nerve At Elbow (Left); Lithotripsy; Hemorroidectomy; Colonoscopy; robot assisted laparoscopic prostatectomy, retropubic radical, including nerve sparing (N/A, 02/02/2024);  robot assisted laparoscopic lymphadenectomy pelvic (Bilateral, 02/02/2024); and laparoscopic urethral suspension for stress incontinence (N/A, 02/02/2024).   Medications and Allergies  Current Medications  Current Outpatient Medications on File Prior to Visit  Medication Sig Dispense Refill  . lisinopril-hydrochlorothiazide (PRINZIDE,ZESTORETIC) 20-12.5 mg tablet TAKE (2) TABLETS BY MOUTH ONCE DAILY. (Patient taking differently: Take 1 tablet by mouth once daily) 60 tablet 0  . omeprazole (PRILOSEC OTC) 20 MG EC tablet Take 20 mg by mouth once daily    . sildenafiL (VIAGRA) 100 MG tablet Take 1 tablet (100 mg total) by mouth once daily as needed for Erectile Dysfunction 20 tablet 1  . tadalafiL (CIALIS) 20 MG tablet Take 1 tablet (20 mg total) by mouth 3 (three) times a week Take one tablet daily    . atorvastatin (LIPITOR) 40 MG tablet Take 40 mg by mouth once daily    . oxyBUTYnin (DITROPAN) 5 mg tablet Take 1 tablet (5 mg total) by mouth 3 (three) times daily as needed (bladder spasms) for up to 7 days (Patient not taking: Reported on 03/22/2024) 21 tablet 0  . respiratory syncytial virus vaccine (ABRYSVO, PF,) 120 mcg/0.5 mL IM injection Inject 0.5 mLs into the muscle once    . traMADoL (ULTRAM) 50 mg tablet Take 1 tablet (50 mg total) by mouth every 6 (six) hours as needed for Pain for up to 5 doses (Patient not taking: Reported on 02/15/2024) 5 tablet 0   No current facility-administered medications on file prior to visit.    Allergies: Morphine (bulk), Other, and Opioids - morphine analogues  Social and Family History  Social History  reports  that he has never smoked. He has been exposed to tobacco smoke. He has never used smokeless tobacco. He reports that he does not currently use alcohol. He reports that he does not use drugs.  Family History No family history on file.  Review of Systems   Review of Systems  Cardiovascular:  Negative for chest pain, palpitations, orthopnea,  claudication, leg swelling and PND.      Physical Examination   Vitals:BP 132/88 (BP Location: Left upper arm, Patient Position: Sitting, BP Cuff Size: Adult)   Pulse 76   Ht 182.9 cm (6')   Wt (!) 113.4 kg (250 lb)   SpO2 98%   BMI 33.91 kg/m  Ht:182.9 cm (6') Wt:(!) 113.4 kg (250 lb) ADJ:Anib surface area is 2.4 meters squared. Body mass index is 33.91 kg/m.  Physical Exam Vitals reviewed.  HENT:     Head: Normocephalic and atraumatic.   Cardiovascular:     Rate and Rhythm: Normal rate and regular rhythm.     Pulses: Normal pulses.     Heart sounds: Normal heart sounds. No murmur heard. Pulmonary:     Effort: Pulmonary effort is normal.     Breath sounds: Normal breath sounds.  Abdominal:     General: Bowel sounds are normal.   Musculoskeletal:     Right lower leg: No edema.     Left lower leg: No edema.   Skin:    General: Skin is warm and dry.   Neurological:     Mental Status: He is alert.       Data & Results   No results for input(s): CHOLTOTAL, HDL, LDLCALC, LDLDIRECT, LDL, VLDL, TRIG in the last 73719 hours.  Recent Labs    12/28/23 1246 02/02/24 1216  NA 138 137  K 3.6 3.8  BUN 10 16  CREATININE 0.9 1.0  CO2 27 26  GLUCOSE 121 162*    Recent Labs    12/28/23 1246 02/02/24 0602 02/02/24 1216  WBC 6.5 6.4 13.4*  HGB 14.3 14.1 12.5*  HCT 42.7 41.5 35.7*  MCV 82 81 80  PLT 389 362 329    Recent Labs    12/28/23 1246  INR 1.1        Assessment   63 y.o. male with  Encounter Diagnoses  Name Primary?  . Mixed hyperlipidemia Yes  . HTN, goal below 130/80   . Pre-operative clearance         Plan   No orders of the defined types were placed in this encounter.  Assessment & Plan   Postoperative numbness and tingling in leg Postoperative numbness and tingling in the leg, likely due to nerve irritation or damage during surgery. Initially affected both legs, now localized to one leg, described as a  needle-like sensation. Possible causes include surgical positioning or nerve impact during lymph node removal.  Postoperative leg swelling Postoperative leg swelling around the ankles, likely due to lymph node removal affecting lymphatic drainage. Swelling worsens throughout the day and is absent in the morning. Possible contribution from venous insufficiency.  Urinary incontinence post-prostatectomy Urinary incontinence post-prostatectomy with noted improvement. Uses one Depend per day, fewer nocturnal awakenings. Advised to limit fluid intake after 5-6 PM to reduce nocturia. Experiences some urgency and frequency, improving. Manages without leakage during the day unless coughing or sneezing.  Prostate cancer Aggressive, fast-growing prostate cancer treated with prostatectomy and lymph node removal. All lymph nodes negative for cancer, PSA level 0.01, indicating successful treatment. Surgery was urgent due to  cancer's aggressive nature, with a six-month window advised to prevent metastasis beyond the prostate.  Return in about 6 months (around 09/21/2024).   Attestation Statement:   I personally performed the service, non-incident to. (WP)   SABINA CUSTOVIC, DO    Sabina Custovic, DO

## 2024-03-26 ENCOUNTER — Telehealth: Payer: Self-pay | Admitting: Physician Assistant

## 2024-03-26 NOTE — Telephone Encounter (Signed)
 Received a call from [facility], the referral coordinator at [facility], regarding a question about the patient's referral. I informed her that the patient had his procedure completed on 11/04/23 with Dr. Ole Berkeley. Stevens Eland stated that was helpful and confirmed she could now remove the patient from her queue.

## 2024-04-10 ENCOUNTER — Other Ambulatory Visit: Payer: Self-pay

## 2024-04-10 ENCOUNTER — Emergency Department (HOSPITAL_COMMUNITY)

## 2024-04-10 ENCOUNTER — Observation Stay (HOSPITAL_COMMUNITY)
Admission: EM | Admit: 2024-04-10 | Discharge: 2024-04-11 | Disposition: A | Discharge status: Home / Self Care | Source: Intra-hospital | Attending: Internal Medicine | Admitting: Internal Medicine

## 2024-04-10 ENCOUNTER — Encounter (HOSPITAL_COMMUNITY): Payer: Self-pay | Admitting: Emergency Medicine

## 2024-04-10 DIAGNOSIS — R531 Weakness: Secondary | ICD-10-CM | POA: Diagnosis present

## 2024-04-10 DIAGNOSIS — R200 Anesthesia of skin: Secondary | ICD-10-CM | POA: Insufficient documentation

## 2024-04-10 DIAGNOSIS — E669 Obesity, unspecified: Secondary | ICD-10-CM | POA: Insufficient documentation

## 2024-04-10 DIAGNOSIS — Z8546 Personal history of malignant neoplasm of prostate: Secondary | ICD-10-CM | POA: Insufficient documentation

## 2024-04-10 DIAGNOSIS — I639 Cerebral infarction, unspecified: Secondary | ICD-10-CM | POA: Diagnosis not present

## 2024-04-10 DIAGNOSIS — R2240 Localized swelling, mass and lump, unspecified lower limb: Secondary | ICD-10-CM | POA: Insufficient documentation

## 2024-04-10 DIAGNOSIS — Z79899 Other long term (current) drug therapy: Secondary | ICD-10-CM | POA: Diagnosis not present

## 2024-04-10 DIAGNOSIS — I10 Essential (primary) hypertension: Secondary | ICD-10-CM | POA: Diagnosis not present

## 2024-04-10 DIAGNOSIS — E785 Hyperlipidemia, unspecified: Secondary | ICD-10-CM | POA: Diagnosis not present

## 2024-04-10 DIAGNOSIS — R4781 Slurred speech: Secondary | ICD-10-CM | POA: Diagnosis present

## 2024-04-10 LAB — COMPREHENSIVE METABOLIC PANEL WITH GFR
ALT: 18 U/L (ref 0–44)
AST: 17 U/L (ref 15–41)
Albumin: 3.5 g/dL (ref 3.5–5.0)
Alkaline Phosphatase: 69 U/L (ref 38–126)
Anion gap: 8 (ref 5–15)
BUN: 17 mg/dL (ref 8–23)
CO2: 24 mmol/L (ref 22–32)
Calcium: 8.7 mg/dL — ABNORMAL LOW (ref 8.9–10.3)
Chloride: 107 mmol/L (ref 98–111)
Creatinine, Ser: 0.75 mg/dL (ref 0.61–1.24)
GFR, Estimated: >60 mL/min (ref >60)
Glucose, Bld: 100 mg/dL — ABNORMAL HIGH (ref 70–99)
Potassium: 3.6 mmol/L (ref 3.5–5.1)
Sodium: 139 mmol/L (ref 135–145)
Total Bilirubin: 0.6 mg/dL (ref 0.0–1.2)
Total Protein: 6.9 g/dL (ref 6.5–8.1)

## 2024-04-10 LAB — TROPONIN I (HIGH SENSITIVITY): Troponin I (High Sensitivity): 5 ng/L (ref <18)

## 2024-04-10 LAB — HEMOGLOBIN A1C
Hgb A1c MFr Bld: 4.7 % — ABNORMAL LOW (ref 4.8–5.6)
Mean Plasma Glucose: 88.19 mg/dL

## 2024-04-10 LAB — CBC
HCT: 37.3 % — ABNORMAL LOW (ref 39.0–52.0)
Hemoglobin: 12.1 g/dL — ABNORMAL LOW (ref 13.0–17.0)
MCH: 26.8 pg (ref 26.0–34.0)
MCHC: 32.4 g/dL (ref 30.0–36.0)
MCV: 82.5 fL (ref 80.0–100.0)
Platelets: 357 10*3/uL (ref 150–400)
RBC: 4.52 MIL/uL (ref 4.22–5.81)
RDW: 14.1 % (ref 11.5–15.5)
WBC: 5.4 10*3/uL (ref 4.0–10.5)
nRBC: 0 % (ref 0.0–0.2)

## 2024-04-10 LAB — CBG MONITORING, ED: Glucose-Capillary: 104 mg/dL — ABNORMAL HIGH (ref 70–99)

## 2024-04-10 MED ORDER — ASPIRIN 81 MG PO CHEW
324.0000 mg | CHEWABLE_TABLET | Freq: Once | ORAL | Status: AC
  Administered 2024-04-10: 324 mg via ORAL
  Filled 2024-04-10: qty 4

## 2024-04-10 MED ORDER — SODIUM CHLORIDE 0.9 % IV SOLN: INTRAVENOUS | Status: AC

## 2024-04-10 MED ORDER — NITROGLYCERIN 0.4 MG SL SUBL: 0.4000 mg | SUBLINGUAL_TABLET | Freq: Once | SUBLINGUAL | Status: DC

## 2024-04-10 MED ORDER — STROKE: EARLY STAGES OF RECOVERY BOOK
Freq: Once | Status: AC
  Filled 2024-04-10: qty 1

## 2024-04-10 MED ORDER — ENOXAPARIN SODIUM 40 MG/0.4ML IJ SOSY
40.0000 mg | PREFILLED_SYRINGE | INTRAMUSCULAR | Status: DC
  Administered 2024-04-10 – 2024-04-11 (×2): 40 mg via SUBCUTANEOUS
  Filled 2024-04-10 (×2): qty 0.4

## 2024-04-10 MED ORDER — CLOPIDOGREL BISULFATE 75 MG PO TABS
300.0000 mg | ORAL_TABLET | Freq: Once | ORAL | Status: AC
  Administered 2024-04-10: 300 mg via ORAL
  Filled 2024-04-10: qty 4

## 2024-04-10 MED ORDER — ACETAMINOPHEN 650 MG RE SUPP: 650.0000 mg | RECTAL | Status: DC | PRN

## 2024-04-10 MED ORDER — SENNOSIDES-DOCUSATE SODIUM 8.6-50 MG PO TABS: 1.0000 | ORAL_TABLET | Freq: Every evening | ORAL | Status: DC | PRN

## 2024-04-10 MED ORDER — ACETAMINOPHEN 160 MG/5ML PO SOLN: 650.0000 mg | ORAL | Status: DC | PRN

## 2024-04-10 MED ORDER — ATORVASTATIN CALCIUM 40 MG PO TABS
40.0000 mg | ORAL_TABLET | Freq: Every day | ORAL | Status: DC
  Administered 2024-04-10 – 2024-04-11 (×2): 40 mg via ORAL
  Filled 2024-04-10 (×2): qty 1

## 2024-04-10 MED ORDER — ACETAMINOPHEN 325 MG PO TABS
650.0000 mg | ORAL_TABLET | ORAL | Status: DC | PRN
  Administered 2024-04-10 (×2): 650 mg via ORAL
  Filled 2024-04-10 (×2): qty 2

## 2024-04-10 MED ORDER — GADOBUTROL 1 MMOL/ML IV SOLN
10.0000 mL | Freq: Once | INTRAVENOUS | Status: AC | PRN
  Administered 2024-04-10: 10 mL via INTRAVENOUS

## 2024-04-10 NOTE — H&P (Signed)
 History and Physical    Don Morgan FMW:[id number] DOB: Oct 06, 1961 DOA: 04/10/2024  PCP: Pcp, No  Patient coming from: Home  I have personally briefly reviewed patient's old medical records in [facility]  Chief Complaint: Left-sided weakness  HPI: Don Morgan is a 63 y.o. male with medical history significant of hypertension and hyperlipidemia presented to ED with left-sided weakness.  According to patient, he was in his usual state of health at around 5:20 AM when he woke up and while he was in the shower, he noticed left-sided weakness all the way from the face to the leg.  EMS was called.  He was hemodynamically stable upon arrival of the EMS but still having the symptoms which were already resolving however by the time he arrived to the ED, all the symptoms had completely resolved except some numbness at the angle of the left side of the mouth.  No previous history of stroke.  Per patient, he then developed similar symptoms which only occurred in the left arm and the left face but lasted less than 5 minutes and resolved again spontaneously.  Currently he has no complaints at all.  ED Course: Upon arrival to ED, patient's symptoms had already resolved, his blood pressure was slightly elevated 154/83.  Basic labs CBC, CMP was unremarkable along with troponin and EKG.  CT head was unremarkable however MRI brain confirmed right PCA stroke and right P2/P3 severe stenosis.  Case was discussed by ED with neurologist, per their recommendation, patient was given aspirin and Plavix and they recommended admission under hospitalist service.  Review of Systems: As per HPI otherwise negative.    Past Medical History:  Diagnosis Date   GERD (gastroesophageal reflux disease)    History of kidney stones    Hyperlipidemia    Hypertension    Internal hemorrhoid, bleeding    OSA (obstructive sleep apnea)    per pt has not gone back to get fiited for cpap yet---  was recommeded cpap and wt loss     Past Surgical History:  Procedure Laterality Date   ANTERIOR CERVICAL DISCECTOMY  01/08/2009   C6 -- C7  TOTAL DISK REPLACEMENT   CARDIOVASCULAR STRESS TEST  11-07-2015   dr florencio [facility] clinic)   normal perfusion nuclear study w/ no ischemia/  normal LV function and wall motion, ef 57%   COLONOSCOPY WITH PROPOFOL  N/A 11/04/2023   Procedure: COLONOSCOPY WITH PROPOFOL ;  Surgeon: Jinny Carmine, MD;  Location: [facility] ENDOSCOPY;  Service: Endoscopy;  Laterality: N/A;   CYSTOSCOPY/RETROGRADE/URETEROSCOPY/STONE EXTRACTION WITH BASKET  2014   EXTRACORPOREAL SHOCK WAVE LITHOTRIPSY  2014   POLYPECTOMY  11/04/2023   Procedure: POLYPECTOMY;  Surgeon: Jinny Carmine, MD;  Location: [facility] ENDOSCOPY;  Service: Endoscopy;;   PROSTATECTOMY     TRANSANAL HEMORRHOIDAL DEARTERIALIZATION N/A 01/26/2017   Procedure: TRANSANAL HEMORRHOIDAL DEARTERIALIZATION;  Surgeon: Bernarda Ned, MD;  Location: [facility];  Service: General;  Laterality: N/A;   TRANSTHORACIC ECHOCARDIOGRAM  11-07-2015   dr florencio   mild LVH,  ef 55%/  mild MR and TR/  trivial PR     reports that he has never smoked. He has never used smokeless tobacco. He reports that he does not drink alcohol and does not use drugs.  Allergies  Allergen Reactions   Other     Other reaction(s): Unknown   Morphine And Codeine Rash    Family History  Problem Relation Age of Onset   Prostate cancer Father    Hematuria Father  Prior to Admission medications   Medication Sig Start Date End Date Taking? Authorizing Provider  atorvastatin (LIPITOR) 40 MG tablet Take 40 mg by mouth daily as needed. 04/27/21  Yes [provider]  calcium carbonate (TUMS - DOSED IN MG ELEMENTAL CALCIUM) 500 MG chewable tablet Chew 1 tablet by mouth as needed for indigestion or heartburn.   Yes [provider]  CIALIS 20 MG tablet Take 20 mg by mouth daily. 09/01/23  Yes [provider]  lisinopril-hydrochlorothiazide  (PRINZIDE,ZESTORETIC) 20-12.5 MG tablet Take 1 tablet by mouth daily.   Yes [provider]  sildenafil (VIAGRA) 100 MG tablet Take by mouth. 04/27/21  Yes [provider]    Physical Exam: Vitals:   04/10/24 1300 04/10/24 1315 04/10/24 1330 04/10/24 1516  BP: (!) 146/87 (!) 144/104 137/82   Pulse: 68 69 67   Resp: 15 13 16    Temp:    98 F (36.7 C)  TempSrc:    Oral  SpO2: 99% 99% 100%   Weight:      Height:        Constitutional: NAD, calm, comfortable Vitals:   04/10/24 1300 04/10/24 1315 04/10/24 1330 04/10/24 1516  BP: (!) 146/87 (!) 144/104 137/82   Pulse: 68 69 67   Resp: 15 13 16    Temp:    98 F (36.7 C)  TempSrc:    Oral  SpO2: 99% 99% 100%   Weight:      Height:       Eyes: PERRL, lids and conjunctivae normal ENMT: Mucous membranes are moist. Posterior pharynx clear of any exudate or lesions.Normal dentition.  Neck: normal, supple, no masses, no thyromegaly Respiratory: clear to auscultation bilaterally, no wheezing, no crackles. Normal respiratory effort. No accessory muscle use.  Cardiovascular: Regular rate and rhythm, no murmurs / rubs / gallops. No extremity edema. 2+ pedal pulses. No carotid bruits.  Abdomen: no tenderness, no masses palpated. No hepatosplenomegaly. Bowel sounds positive.  Musculoskeletal: no clubbing / cyanosis. No joint deformity upper and lower extremities. Good ROM, no contractures. Normal muscle tone.  Skin: no rashes, lesions, ulcers. No induration Neurologic: CN 2-12 grossly intact. Sensation intact, DTR normal. Strength 5/5 in all 4.  Psychiatric: Normal judgment and insight. Alert and oriented x 3. Normal mood.    Labs on Admission: I have personally reviewed following labs and imaging studies  CBC: Recent Labs  Lab 04/10/24 0803  WBC 5.4  HGB 12.1*  HCT 37.3*  MCV 82.5  PLT 357   Basic Metabolic Panel: Recent Labs  Lab 04/10/24 0803  NA 139  K 3.6  CL 107  CO2 24  GLUCOSE 100*  BUN 17   CREATININE 0.75  CALCIUM 8.7*   GFR: Estimated Creatinine Clearance: 118.9 mL/min (by C-G formula based on SCr of 0.75 mg/dL). Liver Function Tests: Recent Labs  Lab 04/10/24 0803  AST 17  ALT 18  ALKPHOS 69  BILITOT 0.6  PROT 6.9  ALBUMIN 3.5   No results for input(s): LIPASE, AMYLASE in the last 168 hours. No results for input(s): AMMONIA in the last 168 hours. Coagulation Profile: No results for input(s): INR, PROTIME in the last 168 hours. Cardiac Enzymes: No results for input(s): CKTOTAL, CKMB, CKMBINDEX, TROPONINI in the last 168 hours. BNP (last 3 results) No results for input(s): PROBNP in the last 8760 hours. HbA1C: No results for input(s): HGBA1C in the last 72 hours. CBG: Recent Labs  Lab 04/10/24 0657  GLUCAP 104*   Lipid Profile: No  results for input(s): CHOL, HDL, LDLCALC, TRIG, CHOLHDL, LDLDIRECT in the last 72 hours. Thyroid Function Tests: No results for input(s): TSH, T4TOTAL, FREET4, T3FREE, THYROIDAB in the last 72 hours. Anemia Panel: No results for input(s): VITAMINB12, FOLATE, FERRITIN, TIBC, IRON, RETICCTPCT in the last 72 hours. Urine analysis:    Component Value Date/Time   COLORURINE YELLOW (A) 01/29/2017 1954   APPEARANCEUR Clear 11/30/2023 0950   LABSPEC 1.014 01/29/2017 1954   PHURINE 7.0 01/29/2017 1954   GLUCOSEU Negative 11/30/2023 0950   HGBUR SMALL (A) 01/29/2017 1954   BILIRUBINUR Negative 11/30/2023 0950   KETONESUR 5 (A) 01/29/2017 1954   PROTEINUR Negative 11/30/2023 0950   PROTEINUR NEGATIVE 01/29/2017 1954   NITRITE Negative 11/30/2023 0950   NITRITE NEGATIVE 01/29/2017 1954   LEUKOCYTESUR Negative 11/30/2023 0950    Radiological Exams on Admission: MR Brain Wo Contrast (neuro protocol) Addendum Date: 04/10/2024 ADDENDUM REPORT: 04/10/2024 13:00 ADDENDUM: These results were called by telephone at the time of interpretation on 04/10/2024 at 12:54 pm to provider  KEVIN STEINL , who verbally acknowledged these results. Electronically Signed   By: Donnice Mania M.D.   On: 04/10/2024 13:00   Result Date: 04/10/2024 CLINICAL DATA:  Neuro deficit, concern for stroke, left-sided numbness. EXAM: MRI HEAD WITHOUT CONTRAST MRA HEAD WITHOUT CONTRAST MRA NECK WITHOUT AND WITH CONTRAST TECHNIQUE: Multiplanar, multi-echo pulse sequences of the brain and surrounding structures were acquired without intravenous contrast. Angiographic images of the Circle of Willis were acquired using MRA technique without intravenous contrast. Angiographic images of the neck were acquired using MRA technique without and with intravenous contrast. Carotid stenosis measurements (when applicable) are obtained utilizing NASCET criteria, using the distal internal carotid diameter as the denominator. CONTRAST:  10mL GADAVIST  GADOBUTROL  1 MMOL/ML IV SOLN COMPARISON:  Same day CT head. FINDINGS: MRI HEAD FINDINGS Brain: Small foci of acute infarct in the right occipital cortex and subcortical white matter within the medial right occipital lobe. No evidence of intracranial hemorrhage. White matter is unremarkable. No edema, mass effect, or midline shift. Posterior fossa is unremarkable. Normal appearance of midline structures. The basilar cisterns are patent. No extra-axial fluid collections. Ventricles: Normal size and configuration of the ventricles. Vascular: Skull base flow voids are visualized. Skull and upper cervical spine: No focal abnormality. Sinuses/Orbits: Orbits are symmetric. Mucous retention cysts in the superior aspect of both maxillary sinuses. Other: Mastoid air cells are clear. MRA HEAD FINDINGS Anterior circulation: The intracranial internal carotid arteries are patent bilaterally. The right M1 segment and right MCA trifurcation is patent. There is mild atherosclerotic irregularity of right M2 branches without occlusion or high-grade stenosis. The left M1 segment and left MCA bifurcation are  patent. Atherosclerotic irregularity of multiple left M2 branches without occlusion or high-grade stenosis. A1 and A2 segments are patent bilaterally. 1.5 mm outpouching along the left A2 segment concerning for aneurysm. Distal ACA branches are symmetric. Posterior circulation: The visualized intracranial vertebral arteries are patent. Left vertebral artery is dominant. Basilar artery is patent. Posterior communicating artery visualized on the right. The right P1 and anterior P2 segments are patent. There is tapering of the P2 posterior segment. Occlusion of the right P2 P/P3 segment. The left PCA is patent. There is severe stenosis of left P4 branches. The superior cerebellar arteries are patent bilaterally. AICA visualized on the left and PICA visualized on the right. Anatomic variants: None. MRA NECK FINDINGS Aortic arch: The visualized aortic arch is patent and normal in caliber without evidence of aneurysm or significant atherosclerotic  plaque. Three vessel configuration of the aortic arch. The bilateral subclavian arteries are patent. Right carotid system: The right carotid artery is patent from the origin to the skull base. Carotid bifurcation is patent without significant stenosis. No significant vascular irregularity. Left carotid system: The left carotid artery is patent from the origin to the skull base. Carotid bifurcation is patent without significant stenosis. No significant vascular irregularity. Vertebral arteries: The left vertebral artery is dominant. Vertebral arteries are patent from the origins to the vertebrobasilar confluence. No significant irregularity over dense of dissection. Other: None IMPRESSION: Small foci of acute infarct in the cortex and subcortical white matter of the right occipital lobe. Severe stenosis of the P2P segment of the right PCA with occlusion at the P2P/P3 segment. On postcontrast MRA neck images there is suggestion of patent distal right PCA branches. Consider CTA for  further evaluation. Intracranial arterial vasculature is otherwise patent. Severe stenosis of P4 branches of the left PCA. Mild atherosclerotic irregularity of multiple bilateral M2 branches. 1.5 mm outpouching along the left A2 segments suggestive of aneurysm. Patent arterial vasculature in the neck. Electronically Signed: By: Donnice Mania M.D. On: 04/10/2024 12:52   MR Angiogram Neck W or Wo Contrast Addendum Date: 04/10/2024 ADDENDUM REPORT: 04/10/2024 13:00 ADDENDUM: These results were called by telephone at the time of interpretation on 04/10/2024 at 12:54 pm to provider KEVIN STEINL , who verbally acknowledged these results. Electronically Signed   By: Donnice Mania M.D.   On: 04/10/2024 13:00   Result Date: 04/10/2024 CLINICAL DATA:  Neuro deficit, concern for stroke, left-sided numbness. EXAM: MRI HEAD WITHOUT CONTRAST MRA HEAD WITHOUT CONTRAST MRA NECK WITHOUT AND WITH CONTRAST TECHNIQUE: Multiplanar, multi-echo pulse sequences of the brain and surrounding structures were acquired without intravenous contrast. Angiographic images of the Circle of Willis were acquired using MRA technique without intravenous contrast. Angiographic images of the neck were acquired using MRA technique without and with intravenous contrast. Carotid stenosis measurements (when applicable) are obtained utilizing NASCET criteria, using the distal internal carotid diameter as the denominator. CONTRAST:  10mL GADAVIST  GADOBUTROL  1 MMOL/ML IV SOLN COMPARISON:  Same day CT head. FINDINGS: MRI HEAD FINDINGS Brain: Small foci of acute infarct in the right occipital cortex and subcortical white matter within the medial right occipital lobe. No evidence of intracranial hemorrhage. White matter is unremarkable. No edema, mass effect, or midline shift. Posterior fossa is unremarkable. Normal appearance of midline structures. The basilar cisterns are patent. No extra-axial fluid collections. Ventricles: Normal size and configuration of the  ventricles. Vascular: Skull base flow voids are visualized. Skull and upper cervical spine: No focal abnormality. Sinuses/Orbits: Orbits are symmetric. Mucous retention cysts in the superior aspect of both maxillary sinuses. Other: Mastoid air cells are clear. MRA HEAD FINDINGS Anterior circulation: The intracranial internal carotid arteries are patent bilaterally. The right M1 segment and right MCA trifurcation is patent. There is mild atherosclerotic irregularity of right M2 branches without occlusion or high-grade stenosis. The left M1 segment and left MCA bifurcation are patent. Atherosclerotic irregularity of multiple left M2 branches without occlusion or high-grade stenosis. A1 and A2 segments are patent bilaterally. 1.5 mm outpouching along the left A2 segment concerning for aneurysm. Distal ACA branches are symmetric. Posterior circulation: The visualized intracranial vertebral arteries are patent. Left vertebral artery is dominant. Basilar artery is patent. Posterior communicating artery visualized on the right. The right P1 and anterior P2 segments are patent. There is tapering of the P2 posterior segment. Occlusion of the right P2  P/P3 segment. The left PCA is patent. There is severe stenosis of left P4 branches. The superior cerebellar arteries are patent bilaterally. AICA visualized on the left and PICA visualized on the right. Anatomic variants: None. MRA NECK FINDINGS Aortic arch: The visualized aortic arch is patent and normal in caliber without evidence of aneurysm or significant atherosclerotic plaque. Three vessel configuration of the aortic arch. The bilateral subclavian arteries are patent. Right carotid system: The right carotid artery is patent from the origin to the skull base. Carotid bifurcation is patent without significant stenosis. No significant vascular irregularity. Left carotid system: The left carotid artery is patent from the origin to the skull base. Carotid bifurcation is patent  without significant stenosis. No significant vascular irregularity. Vertebral arteries: The left vertebral artery is dominant. Vertebral arteries are patent from the origins to the vertebrobasilar confluence. No significant irregularity over dense of dissection. Other: None IMPRESSION: Small foci of acute infarct in the cortex and subcortical white matter of the right occipital lobe. Severe stenosis of the P2P segment of the right PCA with occlusion at the P2P/P3 segment. On postcontrast MRA neck images there is suggestion of patent distal right PCA branches. Consider CTA for further evaluation. Intracranial arterial vasculature is otherwise patent. Severe stenosis of P4 branches of the left PCA. Mild atherosclerotic irregularity of multiple bilateral M2 branches. 1.5 mm outpouching along the left A2 segments suggestive of aneurysm. Patent arterial vasculature in the neck. Electronically Signed: By: Donnice Mania M.D. On: 04/10/2024 12:52   MR ANGIO HEAD WO CONTRAST Addendum Date: 04/10/2024 ADDENDUM REPORT: 04/10/2024 13:00 ADDENDUM: These results were called by telephone at the time of interpretation on 04/10/2024 at 12:54 pm to provider KEVIN STEINL , who verbally acknowledged these results. Electronically Signed   By: Donnice Mania M.D.   On: 04/10/2024 13:00   Result Date: 04/10/2024 CLINICAL DATA:  Neuro deficit, concern for stroke, left-sided numbness. EXAM: MRI HEAD WITHOUT CONTRAST MRA HEAD WITHOUT CONTRAST MRA NECK WITHOUT AND WITH CONTRAST TECHNIQUE: Multiplanar, multi-echo pulse sequences of the brain and surrounding structures were acquired without intravenous contrast. Angiographic images of the Circle of Willis were acquired using MRA technique without intravenous contrast. Angiographic images of the neck were acquired using MRA technique without and with intravenous contrast. Carotid stenosis measurements (when applicable) are obtained utilizing NASCET criteria, using the distal internal carotid  diameter as the denominator. CONTRAST:  10mL GADAVIST  GADOBUTROL  1 MMOL/ML IV SOLN COMPARISON:  Same day CT head. FINDINGS: MRI HEAD FINDINGS Brain: Small foci of acute infarct in the right occipital cortex and subcortical white matter within the medial right occipital lobe. No evidence of intracranial hemorrhage. White matter is unremarkable. No edema, mass effect, or midline shift. Posterior fossa is unremarkable. Normal appearance of midline structures. The basilar cisterns are patent. No extra-axial fluid collections. Ventricles: Normal size and configuration of the ventricles. Vascular: Skull base flow voids are visualized. Skull and upper cervical spine: No focal abnormality. Sinuses/Orbits: Orbits are symmetric. Mucous retention cysts in the superior aspect of both maxillary sinuses. Other: Mastoid air cells are clear. MRA HEAD FINDINGS Anterior circulation: The intracranial internal carotid arteries are patent bilaterally. The right M1 segment and right MCA trifurcation is patent. There is mild atherosclerotic irregularity of right M2 branches without occlusion or high-grade stenosis. The left M1 segment and left MCA bifurcation are patent. Atherosclerotic irregularity of multiple left M2 branches without occlusion or high-grade stenosis. A1 and A2 segments are patent bilaterally. 1.5 mm outpouching along the left A2  segment concerning for aneurysm. Distal ACA branches are symmetric. Posterior circulation: The visualized intracranial vertebral arteries are patent. Left vertebral artery is dominant. Basilar artery is patent. Posterior communicating artery visualized on the right. The right P1 and anterior P2 segments are patent. There is tapering of the P2 posterior segment. Occlusion of the right P2 P/P3 segment. The left PCA is patent. There is severe stenosis of left P4 branches. The superior cerebellar arteries are patent bilaterally. AICA visualized on the left and PICA visualized on the right. Anatomic  variants: None. MRA NECK FINDINGS Aortic arch: The visualized aortic arch is patent and normal in caliber without evidence of aneurysm or significant atherosclerotic plaque. Three vessel configuration of the aortic arch. The bilateral subclavian arteries are patent. Right carotid system: The right carotid artery is patent from the origin to the skull base. Carotid bifurcation is patent without significant stenosis. No significant vascular irregularity. Left carotid system: The left carotid artery is patent from the origin to the skull base. Carotid bifurcation is patent without significant stenosis. No significant vascular irregularity. Vertebral arteries: The left vertebral artery is dominant. Vertebral arteries are patent from the origins to the vertebrobasilar confluence. No significant irregularity over dense of dissection. Other: None IMPRESSION: Small foci of acute infarct in the cortex and subcortical white matter of the right occipital lobe. Severe stenosis of the P2P segment of the right PCA with occlusion at the P2P/P3 segment. On postcontrast MRA neck images there is suggestion of patent distal right PCA branches. Consider CTA for further evaluation. Intracranial arterial vasculature is otherwise patent. Severe stenosis of P4 branches of the left PCA. Mild atherosclerotic irregularity of multiple bilateral M2 branches. 1.5 mm outpouching along the left A2 segments suggestive of aneurysm. Patent arterial vasculature in the neck. Electronically Signed: By: Donnice Mania M.D. On: 04/10/2024 12:52   CT HEAD WO CONTRAST ( ) Result Date: 04/10/2024 CLINICAL DATA:  Neuro deficit, acute, stroke suspected left sided numbness EXAM: CT HEAD WITHOUT CONTRAST TECHNIQUE: Contiguous axial images were obtained from the base of the skull through the vertex without intravenous contrast. RADIATION DOSE REDUCTION: This exam was performed according to the departmental dose-optimization program which includes automated  exposure control, adjustment of the mA and/or kV according to patient size and/or use of iterative reconstruction technique. COMPARISON:  CT of the head dated Mar 01, 2005. FINDINGS: Brain: Normal brain. No evidence of hemorrhage, mass, cortical infarct or hydrocephalus. Vascular: Negative. Skull: Intact and unremarkable. Sinuses/Orbits: Mild polypoid mucosal disease within the floors of the maxillary sinuses. Other: None. IMPRESSION: Negative. Electronically Signed   By: Evalene Coho M.D.   On: 04/10/2024 08:41    EKG: Independently reviewed.  Sinus rhythm with no acute ST-T wave changes.  Assessment/Plan Principal Problem:   Acute ischemic stroke (HCC)   Acute ischemic stroke/hyperlipidemia: Presented with left-sided weakness, lasted for 30 to 40 minutes, resolved before coming to the ED.  Some left mouth angle weakness but no problem with the speech or swallowing.  Received aspirin and Plavix in the ED.  Neurology on board.  MRI confirms acute infarct in Kardex and subcortical white matter of the right occipital lobe and severe stenosis of the P2 P segment of the right PCA with occlusion at the P2 P/P3 segment.  Neurology recommended admitting at [facility], no need to admit to [facility]. Patient appears to be taking atorvastatin 40 mg p.o. as needed.  No previous lipid panel in chart.  Currently he has no symptoms or any focal deficit. -  admit forTelemetry monitoring -Stroke protocol -Allow for permissive hypertension for the first 24-48h - only treat PRN if SBP >220 mmHg or diastolic blood pressure >120. Blood pressures can be gradually normalized to SBP<140 upon discharge. -MRI brain without contrast and MRA head and neck  -Maintain Euthermia.  -ASA given -Echocardiogram to- rule out PFO -Lipid Panel, TSH and A1C -Frequent neuro checks -Atorvastatin PO within 24 hrs.  -Risk factor modification -Consult Neurology -PT/OT eval, Speech consult   DVT prophylaxis: enoxaparin (LOVENOX) injection  40 mg Start: 04/10/24 1530 Code Status: Full code Family Communication: 2 family members present at bedside.  Plan of care discussed with patient in length and he verbalized understanding and agreed with it. Disposition Plan: Potential discharge tomorrow Consults called: Neurology  Fredia Skeeter MD [facility]  *Please note that this is a verbal dictation therefore any spelling or grammatical errors are due to the Dragon Medical One system interpretation.  Please page via Amion and do not message via secure chat for urgent patient care matters. Secure chat can be used for non urgent patient care matters. 04/10/2024, 3:20 PM  To contact the attending provider between 7A-7P or the covering provider during after hours 7P-7A, please log into the web site [email or web address]

## 2024-04-10 NOTE — ED Provider Notes (Signed)
 [place] EMERGENCY DEPARTMENT AT [facility] Provider Note   CSN: [id number] Arrival date & time: 04/10/24  9355     Patient presents with: Weakness   Don Morgan is a 63 y.o. male.   Patient indicates around 0520 this AM, was in shower, felt normal earlier and up until that moment, when had onset of left arm discomfort, and then shortly thereafter felt numbness to left face and left arm and leg. States did not fall, but did not feel as had normal use of left arm.  Had mild right headache. No acute, abrupt or severe head pain. Denies change in vision. A family member that was home did not his speech sounded a little bit off/slurred. Patient indicates symptoms lasted about 30-40 minutes, and have resolved. No hx similar symptoms. No hx tia or cva. Denies chest pain or discomfort. No palpitations. No syncope. No neck or back pain.  EMS notes cbg 95 and vitals stable.   The history is provided by the patient, medical records and the EMS personnel.  Weakness Associated symptoms: no abdominal pain, no chest pain, no cough, no dysuria, no fever, no nausea, no shortness of breath and no vomiting        Prior to Admission medications   Medication Sig Start Date End Date Taking? Authorizing Provider  ABRYSVO 120 MCG/0.5ML injection  09/01/23   [provider]  aspirin EC 81 MG tablet Take 81 mg by mouth daily.    [provider]  atorvastatin (LIPITOR) 40 MG tablet Take 40 mg by mouth daily as needed. 04/27/21   [provider]  calcium carbonate (TUMS - DOSED IN MG ELEMENTAL CALCIUM) 500 MG chewable tablet Chew 1 tablet by mouth as needed for indigestion or heartburn.    [provider]  CIALIS 20 MG tablet Take by mouth. 09/01/23   [provider]  clobetasol cream (TEMOVATE) 0.05 %  09/27/23   [provider]  hydrocortisone  (ANUSOL -HC) 25 MG suppository Place 1 suppository (25 mg total) rectally at bedtime. 10/14/23   [place], PA-C  lisinopril-hydrochlorothiazide (PRINZIDE,ZESTORETIC) 20-12.5 MG tablet Take 1 tablet by mouth 2 (two) times daily.    [provider]  omeprazole (PRILOSEC OTC) 20 MG tablet Take 20 mg by mouth daily.    [provider]  sildenafil (VIAGRA) 100 MG tablet Take by mouth. 04/27/21   [provider]  terbinafine (LAMISIL) 250 MG tablet Take 250 mg by mouth daily. 09/01/23   [provider]  triamcinolone ointment (KENALOG) 0.5 % SMARTSIG:1 Topical Daily PRN 04/27/21   [provider]    Allergies: Other and Morphine and codeine    Review of Systems  Constitutional:  Negative for chills and fever.  HENT:  Negative for sore throat.   Eyes:  Negative for visual disturbance.  Respiratory:  Negative for cough and shortness of breath.   Cardiovascular:  Negative for chest pain, palpitations and leg swelling.  Gastrointestinal:  Negative for abdominal pain, nausea and vomiting.  Genitourinary:  Negative for dysuria and flank pain.  Musculoskeletal:  Negative for back pain and neck pain.  Skin:  Negative for rash.  Neurological:  Positive for weakness and numbness. Negative for syncope.  Psychiatric/Behavioral:  Negative for confusion.     Updated Vital Signs BP (!) 144/89   Pulse 65   Temp 97.9 F (36.6 C)   Resp 13   Ht 1.829 m (6')   Wt 108.9 kg   SpO2 98%  BMI 32.55 kg/m   Physical Exam Vitals and nursing note reviewed.  Constitutional:      Appearance: Normal appearance. He is well-developed.  HENT:     Head: Atraumatic.     Comments: No sinus or temporal tenderness.     Nose: Nose normal.     Mouth/Throat:     Mouth: Mucous membranes are moist.     Pharynx: Oropharynx is clear.   Eyes:     General: No scleral icterus.    Extraocular Movements: Extraocular movements intact.     Conjunctiva/sclera: Conjunctivae normal.     Pupils: Pupils are equal, round, and reactive to light.   Neck:     Vascular: No carotid  bruit.     Trachea: No tracheal deviation.   Cardiovascular:     Rate and Rhythm: Normal rate and regular rhythm.     Pulses: Normal pulses.     Heart sounds: Normal heart sounds. No murmur heard.    No friction rub. No gallop.  Pulmonary:     Effort: Pulmonary effort is normal. No accessory muscle usage or respiratory distress.     Breath sounds: Normal breath sounds.  Abdominal:     General: Bowel sounds are normal. There is no distension.     Palpations: Abdomen is soft. There is no mass.     Tenderness: There is no abdominal tenderness. There is no guarding.   Musculoskeletal:        General: No swelling or tenderness.     Cervical back: Normal range of motion and neck supple. No rigidity or tenderness.     Right lower leg: No edema.     Left lower leg: No edema.   Skin:    General: Skin is warm and dry.     Findings: No rash.   Neurological:     Mental Status: He is alert and oriented to person, place, and time.     Cranial Nerves: No cranial nerve deficit.     Comments: Alert, speech clear. No dysarthria or aphasia noted. Motor fxn grossly intact bil, stre 5/5 bil. No pronator drift. Finger to nose normal. Sens grossly intact.   Psychiatric:        Mood and Affect: Mood normal.     (all labs ordered are listed, but only abnormal results are displayed) Results for orders placed or performed during the hospital encounter of 04/10/24  CBG monitoring, ED   Collection Time: 04/10/24  6:57 AM  Result Value Ref Range   Glucose-Capillary 104 (H) 70 - 99 mg/dL  CBC   Collection Time: 04/10/24  8:03 AM  Result Value Ref Range   WBC 5.4 4.0 - 10.5 K/uL   RBC 4.52 4.22 - 5.81 MIL/uL   Hemoglobin 12.1 (L) 13.0 - 17.0 g/dL   HCT 62.6 (L) 60.9 - 47.9 %   MCV 82.5 80.0 - 100.0 fL   MCH 26.8 26.0 - 34.0 pg   MCHC 32.4 30.0 - 36.0 g/dL   RDW 85.8 88.4 - 84.4 %   Platelets 357 150 - 400 K/uL   nRBC 0.0 0.0 - 0.2 %    EKG: EKG Interpretation Date/Time:  Tuesday April 10 2024 07:36:02 EDT Ventricular Rate:  70 PR Interval:  251 QRS Duration:  96 QT Interval:  386 QTC Calculation: 417 R Axis:   88  Text Interpretation: Sinus rhythm Prolonged PR interval Rightward axis Confirmed by Bernard Drivers ([id number]) on 04/10/2024 7:44:36 AM  Radiology: No results found.  Procedures   Medications Ordered in the ED - No data to display                                  Medical Decision Making Problems Addressed: Acute CVA (cerebrovascular accident) [facility]): acute illness or injury with systemic symptoms that poses a threat to life or bodily functions Essential hypertension: chronic illness or injury Left sided numbness: acute illness or injury with systemic symptoms that poses a threat to life or bodily functions  Amount and/or Complexity of Data Reviewed Independent Historian: EMS    Details: hx External Data Reviewed: notes. Labs: ordered. Decision-making details documented in ED Course. Radiology: ordered and independent interpretation performed. Decision-making details documented in ED Course. ECG/medicine tests: ordered and independent interpretation performed. Decision-making details documented in ED Course. Discussion of management or test interpretation with external provider(s): Neurology, hospitalists.   Risk OTC drugs. Prescription drug management. Decision regarding hospitalization.   Iv ns. Continuous pulse ox and cardiac monitoring. Labs ordered/sent. Imaging ordered.   Differential diagnosis includes CVA, TIA, atypical ACS, etc. Dispo decision including potential need for admission considered - will get labs and imaging and reassess.   Reviewed nursing notes and prior charts for additional history. External reports reviewed. Additional history from: EMS.   Neurology consulted. Discussed pt with Dr Matthews - she indicates add mri brain, mra head/neck, and medicine admission for cva workup.  Resolution symptoms/current NIH SS does not support  tnk admin.   Cardiac monitor: sinus rhythm, rate 70.  Labs reviewed/interpreted by me -  wbc normal. Hgb 12. Chem unremarkable.   CT reviewed/interpreted by me - no hem.   MRI/MRA reviewed/interpreted by me - small punctate acute cva. Vascular disease/narrowing noted.  Asa and plavix po.   Discussed pt again with neurology,, Dr Matthews when MRI/MRA resulted - she indicates put on asa and plavix, admit to hospitalists at [facility] for completion cva workup.  Hospitalists consulted for admission.  Recheck pt, no weakness, no focal neuro deficit noted.   CRITICAL CARE RE: acute cva Performed by: Cella Cappello E Tytus Strahle Total critical care time: 45 minutes Critical care time was exclusive of separately billable procedures and treating other patients. Critical care was necessary to treat or prevent imminent or life-threatening deterioration. Critical care was time spent personally by me on the following activities: development of treatment plan with patient and/or surrogate as well as nursing, discussions with consultants, evaluation of patient's response to treatment, examination of patient, obtaining history from patient or surrogate, ordering and performing treatments and interventions, ordering and review of laboratory studies, ordering and review of radiographic studies, pulse oximetry and re-evaluation of patient's condition.        Final diagnoses:  None    ED Discharge Orders     None          Bernard Drivers, MD 04/10/24 1309

## 2024-04-10 NOTE — ED Triage Notes (Addendum)
 Pt BIB EMS from home with c/o full body left sided weakness and numbness with left side facial droop with slurred speech that started about 0520. All of which resolved by the time EMS arrived at 0600. Endorses dizziness and headache.  140/58 80hr 97RA 95cbg

## 2024-04-10 NOTE — ED Notes (Signed)
 Urine sample in the room if needed.

## 2024-04-11 ENCOUNTER — Other Ambulatory Visit (HOSPITAL_COMMUNITY): Payer: Self-pay | Admitting: *Deleted

## 2024-04-11 ENCOUNTER — Observation Stay (HOSPITAL_COMMUNITY)

## 2024-04-11 DIAGNOSIS — R29700 NIHSS score 0: Secondary | ICD-10-CM

## 2024-04-11 DIAGNOSIS — R200 Anesthesia of skin: Secondary | ICD-10-CM

## 2024-04-11 DIAGNOSIS — I10 Essential (primary) hypertension: Secondary | ICD-10-CM | POA: Diagnosis not present

## 2024-04-11 DIAGNOSIS — I672 Cerebral atherosclerosis: Secondary | ICD-10-CM | POA: Diagnosis not present

## 2024-04-11 DIAGNOSIS — I639 Cerebral infarction, unspecified: Secondary | ICD-10-CM | POA: Diagnosis not present

## 2024-04-11 DIAGNOSIS — I6389 Other cerebral infarction: Secondary | ICD-10-CM | POA: Diagnosis not present

## 2024-04-11 LAB — LIPID PANEL
Cholesterol: 108 mg/dL (ref 0–200)
HDL: 36 mg/dL — ABNORMAL LOW (ref >40)
LDL Cholesterol: 62 mg/dL (ref 0–99)
Total CHOL/HDL Ratio: 3 ratio
Triglycerides: 51 mg/dL (ref <150)
VLDL: 10 mg/dL (ref 0–40)

## 2024-04-11 LAB — ECHOCARDIOGRAM COMPLETE
Area-P 1/2: 4.15 cm2
Height: 72 in
S' Lateral: 2.8 cm
Weight: 3840 [oz_av]

## 2024-04-11 LAB — HIV ANTIBODY (ROUTINE TESTING W REFLEX): HIV Screen 4th Generation wRfx: NONREACTIVE

## 2024-04-11 MED ORDER — CLOPIDOGREL BISULFATE 75 MG PO TABS: 75.0000 mg | ORAL_TABLET | Freq: Every day | ORAL | 2 refills | Status: AC

## 2024-04-11 MED ORDER — ATORVASTATIN CALCIUM 40 MG PO TABS: 40.0000 mg | ORAL_TABLET | Freq: Every day | ORAL | 2 refills | Status: AC

## 2024-04-11 MED ORDER — ASPIRIN 81 MG PO TBEC: 81.0000 mg | DELAYED_RELEASE_TABLET | Freq: Every day | ORAL | 1 refills | Status: AC

## 2024-04-11 MED ORDER — PERFLUTREN LIPID MICROSPHERE
1.0000 mL | INTRAVENOUS | Status: AC | PRN
  Administered 2024-04-11: 3 mL via INTRAVENOUS

## 2024-04-11 NOTE — TOC CM/SW Note (Signed)
 Transition of Care [facility]) - Inpatient Brief Assessment   Patient Details  Name: Don Morgan MRN: [id number] Date of Birth: 12/17/1960  Transition of Care [facility]) CM/SW Contact:    Lucie Lunger, LCSWA Phone Number: 04/11/2024, 10:28 AM   Clinical Narrative: Transition of Care Department [facility]) has reviewed patient and no TOC needs have been identified at this time. We will continue to monitor patient advancement through interdiciplinary progression rounds. If new patient transition needs arise, please place a TOC consult.  Transition of Care Asessment: Insurance and Status: Insurance coverage has been reviewed Patient has primary care physician: No (PCP list added to AVS) Home environment has been reviewed: From home Prior level of function:: Independent Prior/Current Home Services: No current home services Social Drivers of Health Review: SDOH reviewed no interventions necessary Readmission risk has been reviewed: Yes Transition of care needs: no transition of care needs at this time

## 2024-04-11 NOTE — Evaluation (Signed)
 Physical Therapy Evaluation Patient Details Name: Don Morgan MRN: [id number] DOB: 10-Oct-1961 Today's Date: 04/11/2024  History of Present Illness  Don Morgan is a 63 y.o. male with medical history significant of hypertension and hyperlipidemia presented to ED with left-sided weakness.  According to patient, he was in his usual state of health at around 5:20 AM when he woke up and while he was in the shower, he noticed left-sided weakness all the way from the face to the leg.  EMS was called.  He was hemodynamically stable upon arrival of the EMS but still having the symptoms which were already resolving however by the time he arrived to the ED, all the symptoms had completely resolved except some numbness at the angle of the left side of the mouth.  No previous history of stroke.  Per patient, he then developed similar symptoms which only occurred in the left arm and the left face but lasted less than 5 minutes and resolved again spontaneously.  Currently he has no complaints at all.   Clinical Impression  Patient functioning near baseline for functional mobility and gait other than c/o mild numbness left inner thigh since having prostatectomy last year and demonstrates good return for ambulating in room, hallways without loss of balance or need for an AD. Plan:  Patient discharged from physical therapy to care of nursing for ambulation daily as tolerated for length of stay.          If plan is discharge home, recommend the following: Other (comment) (patient at baseline)   Can travel by private vehicle        Equipment Recommendations None recommended by PT  Recommendations for Other Services       Functional Status Assessment Patient has not had a recent decline in their functional status     Precautions / Restrictions Precautions Precautions: None Recall of Precautions/Restrictions: Intact Restrictions Weight Bearing Restrictions Per Provider Order: No      Mobility  Bed  Mobility Overal bed mobility: Independent                  Transfers Overall transfer level: Independent                      Ambulation/Gait Ambulation/Gait assistance: Modified independent (Device/Increase time), Independent Gait Distance (Feet): 100 Feet Assistive device: None Gait Pattern/deviations: WFL(Within Functional Limits) Gait velocity: near normal     General Gait Details: grossly WFL with good return for ambulating in room, hallway without loss of blaance  Stairs            Wheelchair Mobility     Tilt Bed    Modified Rankin (Stroke Patients Only)       Balance Overall balance assessment: Independent                                           Pertinent Vitals/Pain Pain Assessment Pain Assessment: No/denies pain    Home Living Family/patient expects to be discharged to:: Private residence Living Arrangements: Alone Available Help at Discharge: Family;Available PRN/intermittently Type of Home: House Home Access: Stairs to enter Entrance Stairs-Rails: Doctor, general practice of Steps: 3-4   Home Layout: One level Home Equipment: Wheelchair - manual;Other (comment)      Prior Function Prior Level of Function : Independent/Modified Independent  Mobility Comments: Community ambulator without AD; drives ADLs Comments: Independent     Extremity/Trunk Assessment   Upper Extremity Assessment Upper Extremity Assessment: Defer to OT evaluation    Lower Extremity Assessment Lower Extremity Assessment: Overall WFL for tasks assessed    Cervical / Trunk Assessment Cervical / Trunk Assessment: Normal  Communication   Communication Communication: No apparent difficulties    Cognition Arousal: Alert Behavior During Therapy: WFL for tasks assessed/performed   PT - Cognitive impairments: No apparent impairments                         Following commands: Intact        Cueing Cueing Techniques: Verbal cues     General Comments      Exercises     Assessment/Plan    PT Assessment Patient does not need any further PT services  PT Problem List         PT Treatment Interventions      PT Goals (Current goals can be found in the Care Plan section)  Acute Rehab PT Goals Patient Stated Goal: return home PT Goal Formulation: With patient Time For Goal Achievement: 04/11/24 Potential to Achieve Goals: Good    Frequency       Co-evaluation PT/OT/SLP Co-Evaluation/Treatment: Yes Reason for Co-Treatment: To address functional/ADL transfers PT goals addressed during session: Mobility/safety with mobility;Balance OT goals addressed during session: ADL's and self-care       AM-PAC PT 6 Clicks Mobility  Outcome Measure Help needed turning from your back to your side while in a flat bed without using bedrails?: None Help needed moving from lying on your back to sitting on the side of a flat bed without using bedrails?: None Help needed moving to and from a bed to a chair (including a wheelchair)?: None Help needed standing up from a chair using your arms (e.g., wheelchair or bedside chair)?: None Help needed to walk in hospital room?: None Help needed climbing 3-5 steps with a railing? : None 6 Click Score: 24    End of Session   Activity Tolerance: Patient tolerated treatment well Patient left: in bed;with call bell/phone within reach Nurse Communication: Mobility status PT Visit Diagnosis: Unsteadiness on feet (R26.81);Other abnormalities of gait and mobility (R26.89);Muscle weakness (generalized) (M62.81)    Time: 9191-9171 PT Time Calculation (min) (ACUTE ONLY): 20 min   Charges:   PT Evaluation $PT Eval Moderate Complexity: 1 Mod PT Treatments $Therapeutic Activity: 8-22 mins PT General Charges $$ ACUTE PT VISIT: 1 Visit         12:02 PM, 04/11/24 Lynwood Music, MPT Physical Therapist with [facility] [phone] office [phone] mobile phone

## 2024-04-11 NOTE — Discharge Summary (Signed)
 Physician Discharge Summary   Patient: Don Morgan MRN: [id number] DOB: 1961-02-09  Admit date:     04/10/2024  Discharge date: 04/11/24  Discharge Physician: Eric Nunnery   PCP: Pcp, No   Recommendations at discharge:  Repeat basic metabolic panel to follow ultralights renal function Reassess blood pressure and adjust antihypertensive treatment as needed Assist patient with weight loss management Make sure patient follow-up with neurology service in the next 12 weeks as recommended.  Discharge Diagnoses: Principal Problem:   Acute ischemic stroke [facility]) Active Problems:   Essential hypertension   Left sided numbness Class I obesity Lower extremity swelling History of prostate cancer   Brief Hospital admission narrative: As per H&P written by Dr. Vernon on 04/10/2024 Don Morgan is a 63 y.o. male with medical history significant of hypertension and hyperlipidemia presented to ED with left-sided weakness.  According to patient, he was in his usual state of health at around 5:20 AM when he woke up and while he was in the shower, he noticed left-sided weakness all the way from the face to the leg.  EMS was called.  He was hemodynamically stable upon arrival of the EMS but still having the symptoms which were already resolving however by the time he arrived to the ED, all the symptoms had completely resolved except some numbness at the angle of the left side of the mouth.  No previous history of stroke.  Per patient, he then developed similar symptoms which only occurred in the left arm and the left face but lasted less than 5 minutes and resolved again spontaneously.  Currently he has no complaints at all.   ED Course: Upon arrival to ED, patient's symptoms had already resolved, his blood pressure was slightly elevated 154/83.  Basic labs CBC, CMP was unremarkable along with troponin and EKG.  CT head was unremarkable however MRI brain confirmed right PCA stroke and right P2/P3 severe  stenosis.  Case was discussed by ED with neurologist, per their recommendation, patient was given aspirin and Plavix and they recommended admission under hospitalist service.   Assessment and Plan: 1-acute ischemic stroke -No significant residual deficit appreciated; PT OT without any outpatient recommendation - Patient echo with preserved ejection fraction and no wall motion abnormalities or significant valvular disorder. - CT head without contrast 04/10/2024: No acute abnormality  -MRI brain without contrast 04/10/2024: Small foci of acute infarct in the cortex and subcortical white matter of the right occipital lobe.  -MRA head without contrast 04/10/2024: Severe stenosis of the P2P segment of the right PCA with occlusion at the P2P/P3 segment. On postcontrast MRA neck images there is suggestion of patent distal right PCA branches. Intracranial arterial vasculature is otherwise patent.  Severe stenosis of P4 branches of the left PCA. Mild atherosclerotic irregularity of multiple bilateral M2 branches. 1.5 mm outpouching along the left A2 segments suggestive of aneurysm.  -MRA neck with and without contrast 04/10/2024: Patent arterial vasculature in the neck - Following neurology recommendations patient will take aspirin and Plavix for 20-month followed by just baby aspirin on daily basis as part of secondary prevention strategy - Continue risk factor modification with better blood pressure control and the use of daily statin. - Outpatient follow-up with neurology in 12 weeks recommended.  2-hypertension - Resume home antihypertensive agents - Heart healthy/low-sodium diet discussed with patient.  3-hyperlipidemia - Continue statin  4-class I obesity - Low-calorie diet and portion control discussed with patient -Body mass index is 32.55 kg/m.   5-lower  extremity swelling - Appears to be associated with venous insufficiency - Lower extremity Dopplers demonstrating no DVTs or significant  abnormalities - Compression stockings, leg elevation and low-sodium diet discussed with patient.  6-history of prostate cancer - Continue treatment with urology/oncology as an outpatient.   Consultants: Neurology service Procedures performed: See below for x-ray reports. Disposition: Home Diet recommendation: Heart healthy/low-sodium and low calorie diet.  DISCHARGE MEDICATION: Allergies as of 04/11/2024       Reactions   Other    Other reaction(s): Unknown   Morphine And Codeine Rash        Medication List     STOP taking these medications    Cialis 20 MG tablet Generic drug: tadalafil       TAKE these medications    aspirin EC 81 MG tablet Take 1 tablet (81 mg total) by mouth daily. Swallow whole.   atorvastatin 40 MG tablet Commonly known as: LIPITOR Take 1 tablet (40 mg total) by mouth daily. What changed:  when to take this reasons to take this   calcium carbonate 500 MG chewable tablet Commonly known as: TUMS - dosed in mg elemental calcium Chew 1 tablet by mouth as needed for indigestion or heartburn.   clopidogrel 75 MG tablet Commonly known as: Plavix Take 1 tablet (75 mg total) by mouth daily.   lisinopril-hydrochlorothiazide 20-12.5 MG tablet Commonly known as: ZESTORETIC Take 1 tablet by mouth daily.   sildenafil 100 MG tablet Commonly known as: VIAGRA Take by mouth.        Discharge Exam: Filed Weights   04/10/24 0649  Weight: 108.9 kg   General exam: Alert, awake, oriented x 3 Respiratory system: Clear to auscultation. Respiratory effort normal. Cardiovascular system:RRR. No murmurs, rubs, gallops. Gastrointestinal system: Abdomen is obese, nondistended, soft and nontender. No organomegaly or masses felt. Normal bowel sounds heard. Central nervous system:  No focal neurological deficits. Extremities: No C/C/E, trace edema appreciated bilaterally.  No calf pain. Skin: No rashes, no petechiae. Psychiatry: Judgement and insight  appear normal. Mood & affect appropriate.    Condition at discharge: Stable and improved.  The results of significant diagnostics from this hospitalization (including imaging, microbiology, ancillary and laboratory) are listed below for reference.   Imaging Studies: ECHOCARDIOGRAM COMPLETE Result Date: 04/11/2024    ECHOCARDIOGRAM REPORT   Patient Name:   NURI LARMER Amir Date of Exam: 04/11/2024 Medical Rec #:  [id number]     Height:       72.0 in Accession #:    [id number]    Weight:       240.0 lb Date of Birth:  Feb 16, 1961     BSA:          2.302 m Patient Age:    63 years      BP:           128/60 mmHg Patient Gender: M             HR:           65 bpm. Exam Location:  [facility] Procedure: 2D Echo, Cardiac Doppler, Color Doppler and Intracardiac            Opacification Agent (Both Spectral and Color Flow Doppler were            utilized during procedure). Indications:    Stroke l63.9  History:        Patient has no prior history of Echocardiogram examinations.  Family Hx of Prostate Cancer.  Sonographer:    Aida Pizza RCS Referring Phys: [id number] RAVI PAHWANI IMPRESSIONS  1. Left ventricular ejection fraction, by estimation, is 65 to 70%. The left ventricle has normal function. The left ventricle has no regional wall motion abnormalities. There is severe concentric left ventricular hypertrophy. Left ventricular diastolic  parameters are indeterminate.  2. Definity contrast does not demonstrate focal wall motion abnormality or LV mural thrombus.  3. Right ventricular systolic function is normal. The right ventricular size is normal. Tricuspid regurgitation signal is inadequate for assessing PA pressure.  4. The mitral valve is grossly normal. Trivial mitral valve regurgitation.  5. The aortic valve is tricuspid. Aortic valve regurgitation is not visualized.  6. The inferior vena cava is normal in size with greater than 50% respiratory variability, suggesting right atrial pressure of 3 mmHg.  Comparison(s): No prior Echocardiogram. Degree of LVH suggests potential hypertensive, hypertrophic or infiltrative cardiomyopathy. Chart indicates cardiology follow-up at [facility], consider further outpatient evaluation (cardiac MRI). FINDINGS  Left Ventricle: Left ventricular ejection fraction, by estimation, is 65 to 70%. The left ventricle has normal function. The left ventricle has no regional wall motion abnormalities. Definity contrast agent was given IV to delineate the left ventricular  endocardial borders. The left ventricular internal cavity size was normal in size. There is severe concentric left ventricular hypertrophy. Left ventricular diastolic parameters are indeterminate. Right Ventricle: The right ventricular size is normal. No increase in right ventricular wall thickness. Right ventricular systolic function is normal. Tricuspid regurgitation signal is inadequate for assessing PA pressure. Left Atrium: Left atrial size was normal in size. Right Atrium: Right atrial size was normal in size. Pericardium: There is no evidence of pericardial effusion. Mitral Valve: The mitral valve is grossly normal. Trivial mitral valve regurgitation. Tricuspid Valve: The tricuspid valve is grossly normal. Tricuspid valve regurgitation is trivial. Aortic Valve: The aortic valve is tricuspid. There is mild aortic valve annular calcification. Aortic valve regurgitation is not visualized. Pulmonic Valve: The pulmonic valve was grossly normal. Pulmonic valve regurgitation is trivial. Aorta: The aortic root is normal in size and structure. Venous: The inferior vena cava is normal in size with greater than 50% respiratory variability, suggesting right atrial pressure of 3 mmHg. IAS/Shunts: No atrial level shunt detected by color flow Doppler. Additional Comments: 3D was performed not requiring image post processing on an independent workstation and was indeterminate.  LEFT VENTRICLE PLAX 2D LVIDd:         5.00 cm   Diastology  LVIDs:         2.80 cm   LV e' medial:    8.59 cm/s LV PW:         1.20 cm   LV E/e' medial:  13.0 LV IVS:        1.60 cm   LV e' lateral:   13.70 cm/s LVOT diam:     2.00 cm   LV E/e' lateral: 8.2 LV SV:         81 LV SV Index:   35 LVOT Area:     3.14 cm  RIGHT VENTRICLE RV S prime:     18.50 cm/s TAPSE (M-mode): 2.5 cm LEFT ATRIUM             Index        RIGHT ATRIUM           Index LA diam:        4.10 cm 1.78 cm/m   RA Area:  21.40 cm LA Vol (A2C):   89.1 ml 38.70 ml/m  RA Volume:   66.10 ml  28.71 ml/m LA Vol (A4C):   65.5 ml 28.45 ml/m LA Biplane Vol: 78.7 ml 34.18 ml/m  AORTIC VALVE LVOT Vmax:   122.00 cm/s LVOT Vmean:  84.700 cm/s LVOT VTI:    0.258 m  AORTA Ao Root diam: 3.30 cm MITRAL VALVE MV Area (PHT): 4.15 cm     SHUNTS MV Decel Time: 183 msec     Systemic VTI:  0.26 m MV E velocity: 112.00 cm/s  Systemic Diam: 2.00 cm MV A velocity: 86.90 cm/s MV E/A ratio:  1.29 Jayson Sierras MD Electronically signed by Jayson Sierras MD Signature Date/Time: 04/11/2024/4:32:03 PM    Final    US  Venous Img Lower Bilateral (DVT) Result Date: 04/11/2024 CLINICAL DATA:  Bilateral lower extremity edema EXAM: BILATERAL LOWER EXTREMITY VENOUS DOPPLER ULTRASOUND TECHNIQUE: Gray-scale sonography with graded compression, as well as color Doppler and duplex ultrasound were performed to evaluate the lower extremity deep venous systems from the level of the common femoral vein and including the common femoral, femoral, profunda femoral, popliteal and calf veins including the posterior tibial, peroneal and gastrocnemius veins when visible. The superficial great saphenous vein was also interrogated. Spectral Doppler was utilized to evaluate flow at rest and with distal augmentation maneuvers in the common femoral, femoral and popliteal veins. COMPARISON:  None Available. FINDINGS: RIGHT LOWER EXTREMITY Common Femoral Vein: No evidence of thrombus. Normal compressibility, respiratory phasicity and response to  augmentation. Saphenofemoral Junction: No evidence of thrombus. Normal compressibility and flow on color Doppler imaging. Profunda Femoral Vein: No evidence of thrombus. Normal compressibility and flow on color Doppler imaging. Femoral Vein: No evidence of thrombus. Normal compressibility, respiratory phasicity and response to augmentation. Popliteal Vein: No evidence of thrombus. Normal compressibility, respiratory phasicity and response to augmentation. Calf Veins: No evidence of thrombus. Normal compressibility and flow on color Doppler imaging. Superficial Great Saphenous Vein: No evidence of thrombus. Normal compressibility. Venous Reflux:  None. Other Findings:  None. LEFT LOWER EXTREMITY Common Femoral Vein: No evidence of thrombus. Normal compressibility, respiratory phasicity and response to augmentation. Saphenofemoral Junction: No evidence of thrombus. Normal compressibility and flow on color Doppler imaging. Profunda Femoral Vein: No evidence of thrombus. Normal compressibility and flow on color Doppler imaging. Femoral Vein: No evidence of thrombus. Normal compressibility, respiratory phasicity and response to augmentation. Popliteal Vein: No evidence of thrombus. Normal compressibility, respiratory phasicity and response to augmentation. Calf Veins: No evidence of thrombus. Normal compressibility and flow on color Doppler imaging. Superficial Great Saphenous Vein: No evidence of thrombus. Normal compressibility. Venous Reflux:  None. Other Findings:  None. IMPRESSION: No evidence of deep venous thrombosis in either lower extremity. Electronically Signed   By: Wilkie Lent M.D.   On: 04/11/2024 15:46   MR Brain Wo Contrast (neuro protocol) Addendum Date: 04/10/2024 ADDENDUM REPORT: 04/10/2024 13:00 ADDENDUM: These results were called by telephone at the time of interpretation on 04/10/2024 at 12:54 pm to provider KEVIN STEINL , who verbally acknowledged these results. Electronically Signed   By:  Donnice Mania M.D.   On: 04/10/2024 13:00   Result Date: 04/10/2024 CLINICAL DATA:  Neuro deficit, concern for stroke, left-sided numbness. EXAM: MRI HEAD WITHOUT CONTRAST MRA HEAD WITHOUT CONTRAST MRA NECK WITHOUT AND WITH CONTRAST TECHNIQUE: Multiplanar, multi-echo pulse sequences of the brain and surrounding structures were acquired without intravenous contrast. Angiographic images of the Circle of Willis were acquired using MRA technique without intravenous  contrast. Angiographic images of the neck were acquired using MRA technique without and with intravenous contrast. Carotid stenosis measurements (when applicable) are obtained utilizing NASCET criteria, using the distal internal carotid diameter as the denominator. CONTRAST:  10mL GADAVIST  GADOBUTROL  1 MMOL/ML IV SOLN COMPARISON:  Same day CT head. FINDINGS: MRI HEAD FINDINGS Brain: Small foci of acute infarct in the right occipital cortex and subcortical white matter within the medial right occipital lobe. No evidence of intracranial hemorrhage. White matter is unremarkable. No edema, mass effect, or midline shift. Posterior fossa is unremarkable. Normal appearance of midline structures. The basilar cisterns are patent. No extra-axial fluid collections. Ventricles: Normal size and configuration of the ventricles. Vascular: Skull base flow voids are visualized. Skull and upper cervical spine: No focal abnormality. Sinuses/Orbits: Orbits are symmetric. Mucous retention cysts in the superior aspect of both maxillary sinuses. Other: Mastoid air cells are clear. MRA HEAD FINDINGS Anterior circulation: The intracranial internal carotid arteries are patent bilaterally. The right M1 segment and right MCA trifurcation is patent. There is mild atherosclerotic irregularity of right M2 branches without occlusion or high-grade stenosis. The left M1 segment and left MCA bifurcation are patent. Atherosclerotic irregularity of multiple left M2 branches without occlusion or  high-grade stenosis. A1 and A2 segments are patent bilaterally. 1.5 mm outpouching along the left A2 segment concerning for aneurysm. Distal ACA branches are symmetric. Posterior circulation: The visualized intracranial vertebral arteries are patent. Left vertebral artery is dominant. Basilar artery is patent. Posterior communicating artery visualized on the right. The right P1 and anterior P2 segments are patent. There is tapering of the P2 posterior segment. Occlusion of the right P2 P/P3 segment. The left PCA is patent. There is severe stenosis of left P4 branches. The superior cerebellar arteries are patent bilaterally. AICA visualized on the left and PICA visualized on the right. Anatomic variants: None. MRA NECK FINDINGS Aortic arch: The visualized aortic arch is patent and normal in caliber without evidence of aneurysm or significant atherosclerotic plaque. Three vessel configuration of the aortic arch. The bilateral subclavian arteries are patent. Right carotid system: The right carotid artery is patent from the origin to the skull base. Carotid bifurcation is patent without significant stenosis. No significant vascular irregularity. Left carotid system: The left carotid artery is patent from the origin to the skull base. Carotid bifurcation is patent without significant stenosis. No significant vascular irregularity. Vertebral arteries: The left vertebral artery is dominant. Vertebral arteries are patent from the origins to the vertebrobasilar confluence. No significant irregularity over dense of dissection. Other: None IMPRESSION: Small foci of acute infarct in the cortex and subcortical white matter of the right occipital lobe. Severe stenosis of the P2P segment of the right PCA with occlusion at the P2P/P3 segment. On postcontrast MRA neck images there is suggestion of patent distal right PCA branches. Consider CTA for further evaluation. Intracranial arterial vasculature is otherwise patent. Severe  stenosis of P4 branches of the left PCA. Mild atherosclerotic irregularity of multiple bilateral M2 branches. 1.5 mm outpouching along the left A2 segments suggestive of aneurysm. Patent arterial vasculature in the neck. Electronically Signed: By: Donnice Mania M.D. On: 04/10/2024 12:52   MR Angiogram Neck W or Wo Contrast Addendum Date: 04/10/2024 ADDENDUM REPORT: 04/10/2024 13:00 ADDENDUM: These results were called by telephone at the time of interpretation on 04/10/2024 at 12:54 pm to provider KEVIN STEINL , who verbally acknowledged these results. Electronically Signed   By: Donnice Mania M.D.   On: 04/10/2024 13:00  Result Date: 04/10/2024 CLINICAL DATA:  Neuro deficit, concern for stroke, left-sided numbness. EXAM: MRI HEAD WITHOUT CONTRAST MRA HEAD WITHOUT CONTRAST MRA NECK WITHOUT AND WITH CONTRAST TECHNIQUE: Multiplanar, multi-echo pulse sequences of the brain and surrounding structures were acquired without intravenous contrast. Angiographic images of the Circle of Willis were acquired using MRA technique without intravenous contrast. Angiographic images of the neck were acquired using MRA technique without and with intravenous contrast. Carotid stenosis measurements (when applicable) are obtained utilizing NASCET criteria, using the distal internal carotid diameter as the denominator. CONTRAST:  10mL GADAVIST  GADOBUTROL  1 MMOL/ML IV SOLN COMPARISON:  Same day CT head. FINDINGS: MRI HEAD FINDINGS Brain: Small foci of acute infarct in the right occipital cortex and subcortical white matter within the medial right occipital lobe. No evidence of intracranial hemorrhage. White matter is unremarkable. No edema, mass effect, or midline shift. Posterior fossa is unremarkable. Normal appearance of midline structures. The basilar cisterns are patent. No extra-axial fluid collections. Ventricles: Normal size and configuration of the ventricles. Vascular: Skull base flow voids are visualized. Skull and upper  cervical spine: No focal abnormality. Sinuses/Orbits: Orbits are symmetric. Mucous retention cysts in the superior aspect of both maxillary sinuses. Other: Mastoid air cells are clear. MRA HEAD FINDINGS Anterior circulation: The intracranial internal carotid arteries are patent bilaterally. The right M1 segment and right MCA trifurcation is patent. There is mild atherosclerotic irregularity of right M2 branches without occlusion or high-grade stenosis. The left M1 segment and left MCA bifurcation are patent. Atherosclerotic irregularity of multiple left M2 branches without occlusion or high-grade stenosis. A1 and A2 segments are patent bilaterally. 1.5 mm outpouching along the left A2 segment concerning for aneurysm. Distal ACA branches are symmetric. Posterior circulation: The visualized intracranial vertebral arteries are patent. Left vertebral artery is dominant. Basilar artery is patent. Posterior communicating artery visualized on the right. The right P1 and anterior P2 segments are patent. There is tapering of the P2 posterior segment. Occlusion of the right P2 P/P3 segment. The left PCA is patent. There is severe stenosis of left P4 branches. The superior cerebellar arteries are patent bilaterally. AICA visualized on the left and PICA visualized on the right. Anatomic variants: None. MRA NECK FINDINGS Aortic arch: The visualized aortic arch is patent and normal in caliber without evidence of aneurysm or significant atherosclerotic plaque. Three vessel configuration of the aortic arch. The bilateral subclavian arteries are patent. Right carotid system: The right carotid artery is patent from the origin to the skull base. Carotid bifurcation is patent without significant stenosis. No significant vascular irregularity. Left carotid system: The left carotid artery is patent from the origin to the skull base. Carotid bifurcation is patent without significant stenosis. No significant vascular irregularity.  Vertebral arteries: The left vertebral artery is dominant. Vertebral arteries are patent from the origins to the vertebrobasilar confluence. No significant irregularity over dense of dissection. Other: None IMPRESSION: Small foci of acute infarct in the cortex and subcortical white matter of the right occipital lobe. Severe stenosis of the P2P segment of the right PCA with occlusion at the P2P/P3 segment. On postcontrast MRA neck images there is suggestion of patent distal right PCA branches. Consider CTA for further evaluation. Intracranial arterial vasculature is otherwise patent. Severe stenosis of P4 branches of the left PCA. Mild atherosclerotic irregularity of multiple bilateral M2 branches. 1.5 mm outpouching along the left A2 segments suggestive of aneurysm. Patent arterial vasculature in the neck. Electronically Signed: By: Donnice Mania M.D. On: 04/10/2024 12:52  MR ANGIO HEAD WO CONTRAST Addendum Date: 04/10/2024 ADDENDUM REPORT: 04/10/2024 13:00 ADDENDUM: These results were called by telephone at the time of interpretation on 04/10/2024 at 12:54 pm to provider KEVIN STEINL , who verbally acknowledged these results. Electronically Signed   By: Donnice Mania M.D.   On: 04/10/2024 13:00   Result Date: 04/10/2024 CLINICAL DATA:  Neuro deficit, concern for stroke, left-sided numbness. EXAM: MRI HEAD WITHOUT CONTRAST MRA HEAD WITHOUT CONTRAST MRA NECK WITHOUT AND WITH CONTRAST TECHNIQUE: Multiplanar, multi-echo pulse sequences of the brain and surrounding structures were acquired without intravenous contrast. Angiographic images of the Circle of Willis were acquired using MRA technique without intravenous contrast. Angiographic images of the neck were acquired using MRA technique without and with intravenous contrast. Carotid stenosis measurements (when applicable) are obtained utilizing NASCET criteria, using the distal internal carotid diameter as the denominator. CONTRAST:  10mL GADAVIST  GADOBUTROL  1  MMOL/ML IV SOLN COMPARISON:  Same day CT head. FINDINGS: MRI HEAD FINDINGS Brain: Small foci of acute infarct in the right occipital cortex and subcortical white matter within the medial right occipital lobe. No evidence of intracranial hemorrhage. White matter is unremarkable. No edema, mass effect, or midline shift. Posterior fossa is unremarkable. Normal appearance of midline structures. The basilar cisterns are patent. No extra-axial fluid collections. Ventricles: Normal size and configuration of the ventricles. Vascular: Skull base flow voids are visualized. Skull and upper cervical spine: No focal abnormality. Sinuses/Orbits: Orbits are symmetric. Mucous retention cysts in the superior aspect of both maxillary sinuses. Other: Mastoid air cells are clear. MRA HEAD FINDINGS Anterior circulation: The intracranial internal carotid arteries are patent bilaterally. The right M1 segment and right MCA trifurcation is patent. There is mild atherosclerotic irregularity of right M2 branches without occlusion or high-grade stenosis. The left M1 segment and left MCA bifurcation are patent. Atherosclerotic irregularity of multiple left M2 branches without occlusion or high-grade stenosis. A1 and A2 segments are patent bilaterally. 1.5 mm outpouching along the left A2 segment concerning for aneurysm. Distal ACA branches are symmetric. Posterior circulation: The visualized intracranial vertebral arteries are patent. Left vertebral artery is dominant. Basilar artery is patent. Posterior communicating artery visualized on the right. The right P1 and anterior P2 segments are patent. There is tapering of the P2 posterior segment. Occlusion of the right P2 P/P3 segment. The left PCA is patent. There is severe stenosis of left P4 branches. The superior cerebellar arteries are patent bilaterally. AICA visualized on the left and PICA visualized on the right. Anatomic variants: None. MRA NECK FINDINGS Aortic arch: The visualized  aortic arch is patent and normal in caliber without evidence of aneurysm or significant atherosclerotic plaque. Three vessel configuration of the aortic arch. The bilateral subclavian arteries are patent. Right carotid system: The right carotid artery is patent from the origin to the skull base. Carotid bifurcation is patent without significant stenosis. No significant vascular irregularity. Left carotid system: The left carotid artery is patent from the origin to the skull base. Carotid bifurcation is patent without significant stenosis. No significant vascular irregularity. Vertebral arteries: The left vertebral artery is dominant. Vertebral arteries are patent from the origins to the vertebrobasilar confluence. No significant irregularity over dense of dissection. Other: None IMPRESSION: Small foci of acute infarct in the cortex and subcortical white matter of the right occipital lobe. Severe stenosis of the P2P segment of the right PCA with occlusion at the P2P/P3 segment. On postcontrast MRA neck images there is suggestion of patent distal right PCA branches. Consider  CTA for further evaluation. Intracranial arterial vasculature is otherwise patent. Severe stenosis of P4 branches of the left PCA. Mild atherosclerotic irregularity of multiple bilateral M2 branches. 1.5 mm outpouching along the left A2 segments suggestive of aneurysm. Patent arterial vasculature in the neck. Electronically Signed: By: Donnice Mania M.D. On: 04/10/2024 12:52   CT HEAD WO CONTRAST ( ) Result Date: 04/10/2024 CLINICAL DATA:  Neuro deficit, acute, stroke suspected left sided numbness EXAM: CT HEAD WITHOUT CONTRAST TECHNIQUE: Contiguous axial images were obtained from the base of the skull through the vertex without intravenous contrast. RADIATION DOSE REDUCTION: This exam was performed according to the departmental dose-optimization program which includes automated exposure control, adjustment of the mA and/or kV according to  patient size and/or use of iterative reconstruction technique. COMPARISON:  CT of the head dated Mar 01, 2005. FINDINGS: Brain: Normal brain. No evidence of hemorrhage, mass, cortical infarct or hydrocephalus. Vascular: Negative. Skull: Intact and unremarkable. Sinuses/Orbits: Mild polypoid mucosal disease within the floors of the maxillary sinuses. Other: None. IMPRESSION: Negative. Electronically Signed   By: Evalene Coho M.D.   On: 04/10/2024 08:41    Microbiology: Results for orders placed or performed in visit on 11/30/23  Microscopic Examination     Status: None   Collection Time: 11/30/23  9:50 AM   Urine  Result Value Ref Range Status   WBC, UA 0-5 0 - 5 /hpf Final   RBC, Urine 0-2 0 - 2 /hpf Final   Epithelial Cells (non renal) 0-10 0 - 10 /hpf Final   Bacteria, UA Few None seen/Few Final    Labs: CBC: Recent Labs  Lab 04/10/24 0803  WBC 5.4  HGB 12.1*  HCT 37.3*  MCV 82.5  PLT 357   Basic Metabolic Panel: Recent Labs  Lab 04/10/24 0803  NA 139  K 3.6  CL 107  CO2 24  GLUCOSE 100*  BUN 17  CREATININE 0.75  CALCIUM 8.7*   Liver Function Tests: Recent Labs  Lab 04/10/24 0803  AST 17  ALT 18  ALKPHOS 69  BILITOT 0.6  PROT 6.9  ALBUMIN 3.5   CBG: Recent Labs  Lab 04/10/24 0657  GLUCAP 104*    Discharge time spent: less than 30 minutes.  Signed: Eric Nunnery, MD [facility] 04/11/2024

## 2024-04-11 NOTE — Discharge Instructions (Signed)

## 2024-04-11 NOTE — Consult Note (Signed)
 I connected with  Don Morgan on 04/11/24 by a video enabled telemedicine application and verified that I am speaking with the correct person using two identifiers.   I discussed the limitations of evaluation and management by telemedicine. The patient expressed understanding and agreed to proceed.  Location of patient: [facility] Location of physician: [facility]   Neurology Consultation Reason for Consult: Stroke Referring Physician: Dr. Franky Gaul  CC: Left-sided weakness numbness facial droop and slurred speech  History is obtained from: Patient, chart review  HPI: Don Morgan is a 63 y.o. male with past medical history of hypertension hyperlipidemia, obstructive sleep apnea who presented with left-sided weakness and numbness.  Patient states yesterday morning at around 5:20 AM he was in the shower and noticed weakness and numbness in his left arm.  Symptoms lasted for about 40 minutes to complete resolution.  He came to the emergency room and again noticed similar symptoms for few minutes.  Denies any similar symptoms in the past.  States symptoms have not recurred overnight.  Last known normal: 04/10/2024 at around 520 Event happened at home No tPA as symptoms had resolved No thrombectomy no vessel occlusion mRS 0   ROS: All other systems reviewed and negative except as noted in the HPI.   Past Medical History:  Diagnosis Date   GERD (gastroesophageal reflux disease)    History of kidney stones    Hyperlipidemia    Hypertension    Internal hemorrhoid, bleeding    OSA (obstructive sleep apnea)    per pt has not gone back to get fiited for cpap yet---  was recommeded cpap and wt loss    Family History  Problem Relation Age of Onset   Prostate cancer Father    Hematuria Father     Social History:  reports that he has never smoked. He has never used smokeless tobacco. He reports that he does not drink alcohol and does not use drugs.   Medications  Prior to Admission  Medication Sig Dispense Refill Last Dose/Taking   atorvastatin (LIPITOR) 40 MG tablet Take 40 mg by mouth daily as needed.   04/09/2024 Noon   calcium carbonate (TUMS - DOSED IN MG ELEMENTAL CALCIUM) 500 MG chewable tablet Chew 1 tablet by mouth as needed for indigestion or heartburn.   Taking As Needed   CIALIS 20 MG tablet Take 20 mg by mouth daily.   04/09/2024 Noon   lisinopril-hydrochlorothiazide (PRINZIDE,ZESTORETIC) 20-12.5 MG tablet Take 1 tablet by mouth daily.   04/09/2024 Noon   sildenafil (VIAGRA) 100 MG tablet Take by mouth.   Taking      Exam: Current vital signs: BP 128/60 (BP Location: Left Arm)   Pulse 65   Temp 97.9 F (36.6 C) (Oral)   Resp 16   Ht 6' (1.829 m)   Wt 108.9 kg   SpO2 97%   BMI 32.55 kg/m  Vital signs in last 24 hours: Temp:  [97.6 F (36.4 C)-98.6 F (37 C)] 97.9 F (36.6 C) (07/02 0400) Pulse Rate:  [64-72] 65 (07/02 0400) Resp:  [8-23] 16 (07/02 0400) BP: (128-161)/(60-104) 128/60 (07/02 0400) SpO2:  [96 %-100 %] 97 % (07/02 0400)   Physical Exam  Constitutional: Appears well-developed and well-nourished.  Psych: Affect appropriate to situation Neuro: AO x 3, cranial nerves grossly intact, no aphasia or dysarthria, antigravity strength in upper extremities without drift, sensory intact to light touch except baseline decree sensation in left lower extremity secondary to prostate  surgery), FTN intact bilaterally.  Of note patient does report some decrease sensation at the corner of mouth on the left side  NIHSS 0   I have reviewed labs in epic and the results pertinent to this consultation are: CBC:  Recent Labs  Lab 04/10/24 0803  WBC 5.4  HGB 12.1*  HCT 37.3*  MCV 82.5  PLT 357    Basic Metabolic Panel:  Lab Results  Component Value Date   NA 139 04/10/2024   K 3.6 04/10/2024   CO2 24 04/10/2024   GLUCOSE 100 (H) 04/10/2024   BUN 17 04/10/2024   CREATININE 0.75 04/10/2024   CALCIUM 8.7 (L) 04/10/2024    GFRNONAA >60 04/10/2024   GFRAA >60 01/29/2017   Lipid Panel:  Lab Results  Component Value Date   LDLCALC 62 04/11/2024   HgbA1c:  Lab Results  Component Value Date   HGBA1C 4.7 (L) 04/10/2024   Urine Drug Screen: No results found for: LABOPIA, COCAINSCRNUR, LABBENZ, AMPHETMU, THCU, LABBARB  Alcohol Level No results found for: ETH   I have reviewed the images obtained:  CT head without contrast 04/10/2024: No acute abnormality  MRI brain without contrast 04/10/2024: Small foci of acute infarct in the cortex and subcortical white matter of the right occipital lobe.  MRA head without contrast 04/10/2024: Severe stenosis of the P2P segment of the right PCA with occlusion at the P2P/P3 segment. On postcontrast MRA neck images there is suggestion of patent distal right PCA branches. Intracranial arterial vasculature is otherwise patent.  Severe stenosis of P4 branches of the left PCA. Mild atherosclerotic irregularity of multiple bilateral M2 branches. 1.5 mm outpouching along the left A2 segments suggestive of aneurysm.  MRA neck with and without contrast 04/10/2024: Patent arterial vasculature in the neck      ASSESSMENT/PLAN: 63 year old male presented with transient left-sided weakness which has since resolved.  Acute ischemic stroke - Etiology: Likely intracranial atherosclerosis  Recommendations: -Aspirin 81 mg and Plavix at 5 mg daily for 3 months followed by aspirin 81 mg daily - LDL less than 70 therefore continue current management - TTE ordered and pending.  - PT/OT - Stroke education - Goal blood pressure: Normotension as symptoms have already resolved - Follow-up with neurology in 2 to 3 months - Discussed plan with hospitalist via secure chat  Thank you for allowing us  to participate in the care of this patient. If you have any further questions, please contact  me or neurohospitalist.   Arlin Krebs Epilepsy [facility]

## 2024-04-11 NOTE — Plan of Care (Signed)
  Problem: Ischemic Stroke/TIA Tissue Perfusion: Goal: Complications of ischemic stroke/TIA will be minimized Outcome: Progressing   Problem: Health Behavior/Discharge Planning: Goal: Ability to manage health-related needs will improve Outcome: Progressing

## 2024-04-11 NOTE — Evaluation (Signed)
 Occupational Therapy Evaluation Patient Details Name: Don Morgan MRN: [id number] DOB: 1961/04/24 Today's Date: 04/11/2024   History of Present Illness   Don Morgan is a 63 y.o. male with medical history significant of hypertension and hyperlipidemia presented to ED with left-sided weakness.  According to patient, he was in his usual state of health at around 5:20 AM when he woke up and while he was in the shower, he noticed left-sided weakness all the way from the face to the leg.  EMS was called.  He was hemodynamically stable upon arrival of the EMS but still having the symptoms which were already resolving however by the time he arrived to the ED, all the symptoms had completely resolved except some numbness at the angle of the left side of the mouth.  No previous history of stroke.  Per patient, he then developed similar symptoms which only occurred in the left arm and the left face but lasted less than 5 minutes and resolved again spontaneously.  Currently he has no complaints at all. (per MD)     Clinical Impressions Pt agreeable to OT and PT co-evaluation. Pt appears to be near baseline function for ADL's and ambualtion. Pt reports still being mildly unsteady but ambulated in the hall without assist. Pt reports some facial numbness but no  BUE deficits related to hospitalization. Pt is not recommended for further acute OT services and has been discharged to care of nursing staff for remaining length of stay.               Functional Status Assessment   Patient has not had a recent decline in their functional status     Equipment Recommendations   None recommended by OT             Precautions/Restrictions   Precautions Precautions: None Recall of Precautions/Restrictions: Intact Restrictions Weight Bearing Restrictions Per Provider Order: No     Mobility Bed Mobility Overal bed mobility: Independent                  Transfers Overall transfer  level: Independent                        Balance Overall balance assessment: Independent                                         ADL either performed or assessed with clinical judgement   ADL Overall ADL's : Independent                                             Vision Baseline Vision/History: 1 Wears glasses Ability to See in Adequate Light: 1 Impaired Patient Visual Report: Blurring of vision (L eye; has resolved) Vision Assessment?: No apparent visual deficits     Perception Perception: Not tested       Praxis Praxis: Not tested       Pertinent Vitals/Pain Pain Assessment Pain Assessment: No/denies pain     Extremity/Trunk Assessment Upper Extremity Assessment Upper Extremity Assessment: Overall WFL for tasks assessed   Lower Extremity Assessment Lower Extremity Assessment: Defer to PT evaluation   Cervical / Trunk Assessment Cervical / Trunk Assessment: Normal   Communication Communication Communication: No apparent difficulties  Cognition Arousal: Alert Behavior During Therapy: WFL for tasks assessed/performed Cognition: No apparent impairments                               Following commands: Intact       Cueing  General Comments   Cueing Techniques: Verbal cues                 Home Living Family/patient expects to be discharged to:: Private residence Living Arrangements: Alone Available Help at Discharge: Family;Available PRN/intermittently Type of Home: House Home Access: Stairs to enter [employer] of Steps: 3-4 Entrance Stairs-Rails: Right;Left Home Layout: One level     Bathroom Shower/Tub: Producer, television/film/video: Standard Bathroom Accessibility: Yes How Accessible: Accessible via walker;Accessible via wheelchair Home Equipment: Wheelchair - manual;Other (comment) (walking stick)          Prior Functioning/Environment Prior Level of  Function : Independent/Modified Independent             Mobility Comments: Community ambulator without AD; drives ADLs Comments: Independent                             Co-evaluation PT/OT/SLP Co-Evaluation/Treatment: Yes Reason for Co-Treatment: To address functional/ADL transfers   OT goals addressed during session: ADL's and self-care      AM-PAC OT 6 Clicks Daily Activity     Outcome Measure Help from another person eating meals?: None Help from another person taking care of personal grooming?: None Help from another person toileting, which includes using toliet, bedpan, or urinal?: None Help from another person bathing (including washing, rinsing, drying)?: None Help from another person to put on and taking off regular upper body clothing?: None Help from another person to put on and taking off regular lower body clothing?: None 6 Click Score: 24   End of Session    Activity Tolerance: Patient tolerated treatment well Patient left: in bed;with call bell/phone within reach  OT Visit Diagnosis: Other symptoms and signs involving the nervous system (M70.101)                Time: 9181-9171 OT Time Calculation (min): 10 min Charges:  OT General Charges $OT Visit: 1 Visit OT Evaluation $OT Eval Low Complexity: 1 Low  Latia Mataya OT, MOT  Jayson Person 04/11/2024, 8:52 AM

## 2024-04-11 NOTE — Plan of Care (Signed)
  Problem: Education: Goal: Knowledge of General Education information will improve Description: Including pain rating scale, medication(s)/side effects and non-pharmacologic comfort measures Outcome: Progressing   Problem: Clinical Measurements: Goal: Ability to maintain clinical measurements within normal limits will improve Outcome: Progressing   Problem: Ischemic Stroke/TIA Tissue Perfusion: Goal: Complications of ischemic stroke/TIA will be minimized Outcome: Progressing   Problem: Health Behavior/Discharge Planning: Goal: Ability to manage health-related needs will improve Outcome: Progressing

## 2024-04-11 NOTE — Progress Notes (Signed)
*  PRELIMINARY RESULTS* Echocardiogram 2D Echocardiogram has been performed.  Don Morgan 04/11/2024, 2:49 PM

## 2024-04-11 NOTE — Evaluation (Signed)
 Speech Language Pathology Evaluation Patient Details Name: Don Morgan MRN: [id number] DOB: August 27, 1961 Today's Date: 04/11/2024 Time: 9099-9086 SLP Time Calculation (min) (ACUTE ONLY): 13 min  Problem List:  Patient Active Problem List   Diagnosis Date Noted   Acute ischemic stroke (HCC) 04/10/2024   Rectal bleeding 11/04/2023   Polyp of transverse colon 11/04/2023   Elevated PSA 05/04/2021   Family history of prostate cancer 05/04/2021   Personal history of urinary calculi 05/04/2021   Past Medical History:  Past Medical History:  Diagnosis Date   GERD (gastroesophageal reflux disease)    History of kidney stones    Hyperlipidemia    Hypertension    Internal hemorrhoid, bleeding    OSA (obstructive sleep apnea)    per pt has not gone back to get fiited for cpap yet---  was recommeded cpap and wt loss   Past Surgical History:  Past Surgical History:  Procedure Laterality Date   ANTERIOR CERVICAL DISCECTOMY  01/08/2009   C6 -- C7  TOTAL DISK REPLACEMENT   CARDIOVASCULAR STRESS TEST  11-07-2015   dr florencio [facility])   normal perfusion nuclear study w/ no ischemia/  normal LV function and wall motion, ef 57%   COLONOSCOPY WITH PROPOFOL  N/A 11/04/2023   Procedure: COLONOSCOPY WITH PROPOFOL ;  Surgeon: Jinny Carmine, MD;  Location: [facility] ENDOSCOPY;  Service: Endoscopy;  Laterality: N/A;   CYSTOSCOPY/RETROGRADE/URETEROSCOPY/STONE EXTRACTION WITH BASKET  2014   EXTRACORPOREAL SHOCK WAVE LITHOTRIPSY  2014   POLYPECTOMY  11/04/2023   Procedure: POLYPECTOMY;  Surgeon: Jinny Carmine, MD;  Location: [facility] ENDOSCOPY;  Service: Endoscopy;;   PROSTATECTOMY     TRANSANAL HEMORRHOIDAL DEARTERIALIZATION N/A 01/26/2017   Procedure: TRANSANAL HEMORRHOIDAL DEARTERIALIZATION;  Surgeon: Bernarda Ned, MD;  Location: [facility];  Service: General;  Laterality: N/A;   TRANSTHORACIC ECHOCARDIOGRAM  11-07-2015   dr florencio   mild LVH,  ef 55%/  mild MR and TR/  trivial PR    HPI:  Pt is a 63 y.o. male who presented to ED with left-sided weakness, found to have right PCA stroke and right P2/P3 severe stenosis. Pt admitted 7/1. PMHx significant for: HLD, HTN, obstructive sleep apnea, GERD.       MRI Brain without contrast 7/1: Small foci of acute infarct in the cortex and subcortical white matter of the right occipital lobe. Severe stenosis of the P2P segment of the right PCA with occlusion at the P2P/P3 segment. On postcontrast MRA neck images there is  suggestion of patent distal right PCA branches. Consider CTA for  further evaluation.Intracranial arterial vasculature is otherwise patent. Severe stenosis of P4 branches of the left PCA. Mild atherosclerotic irregularity of multiple bilateral M2 branches. 1.5 mm outpouching along the left A2 segments suggestive of aneurysm. Patent arterial vasculature in the neck.   Assessment / Plan / Recommendation Clinical Impression  Pt seen for SLE due to recent CVA. Oriented x4, denied pain. Pt is verbal and able to communicate effectively with 100% speech intelligibility at the conversation level. Oral motor movement WNL but pt reports continued tingling in L corner of lips. 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 Pt endorses baseline attention deficits since childhood but otherwise, no baseline concerns for speech/cognition reported. Pt followed commands with 100% acc IND. Immediately recalled 5 of 5 objects, reduced to 3 of 5 with delayed recall but pt endorsed difficulty attending to tasks due to several people in the room (appears consistent with  his baseline attention deficits). Pt endorsed visual changes consistent with location of CVA that have resolved and no visuospatial deficits noted across tasks. Auditory comprehension WNL.   Pt presents with WNL voice, resonance, motor speech, and cognitive-linguistic skills. No ST services warranted at this time.     SLP  Assessment  SLP Recommendation/Assessment: Patient does not need any further Speech Language Pathology Services SLP Visit Diagnosis: Other (comment) (None)     Assistance Recommended at Discharge  PRN           SLP Evaluation Cognition  Overall Cognitive Status: Within Functional Limits for tasks assessed Arousal/Alertness: Awake/alert Orientation Level: Oriented X4 Year: 2025 Month: July Day of Week: Correct Attention: Divided Divided Attention: Impaired (at baseline) Divided Attention Impairment: Verbal complex Memory: Appears intact Awareness: Appears intact Problem Solving: Appears intact Safety/Judgment: Appears intact       Comprehension  Auditory Comprehension Overall Auditory Comprehension: Appears within functional limits for tasks assessed Yes/No Questions: Within Functional Limits Commands: Within Functional Limits Interfering Components: Attention (reports baseline attention deficits) Visual Recognition/Discrimination Discrimination: Not tested Reading Comprehension Reading Status: Not tested    Expression Expression Primary Mode of Expression: Verbal Verbal Expression Overall Verbal Expression: Appears within functional limits for tasks assessed Initiation: No impairment Repetition: No impairment Naming: No impairment Pragmatics: No impairment   Oral / Motor  Oral Motor/Sensory Function Overall Oral Motor/Sensory Function: Within functional limits (Reports of tingling in L corner of mouth) Motor Speech Overall Motor Speech: Appears within functional limits for tasks assessed Respiration: Within functional limits Phonation: Normal Resonance: Within functional limits Intelligibility: Intelligible Motor Planning: Within functional limits Motor Speech Errors: Not applicable            Waddell JONETTA Novak, MA CCC-SLP 04/11/2024, 9:58 AM

## 2024-04-11 NOTE — Care Management Obs Status (Signed)
 MEDICARE OBSERVATION STATUS NOTIFICATION   Patient Details  Name: Don Morgan MRN: [id number] Date of Birth: [birth year]   Medicare Observation Status Notification Given:  Yes    Duwaine LITTIE Ada 04/11/2024, 10:29 AM

## 2024-04-11 NOTE — Plan of Care (Signed)
